# Patient Record
Sex: Female | Born: 1960 | Race: White | Hispanic: No | Smoking: Current every day smoker
Health system: Southern US, Community
[De-identification: ages and names within clinical notes are randomized; demographics above are authoritative.]

## PROBLEM LIST (undated history)

## (undated) DIAGNOSIS — I10 Essential (primary) hypertension: Secondary | ICD-10-CM

## (undated) DIAGNOSIS — M1990 Unspecified osteoarthritis, unspecified site: Secondary | ICD-10-CM

## (undated) DIAGNOSIS — E785 Hyperlipidemia, unspecified: Secondary | ICD-10-CM

## (undated) DIAGNOSIS — F329 Major depressive disorder, single episode, unspecified: Secondary | ICD-10-CM

## (undated) DIAGNOSIS — J449 Chronic obstructive pulmonary disease, unspecified: Secondary | ICD-10-CM

## (undated) DIAGNOSIS — F32A Depression, unspecified: Secondary | ICD-10-CM

## (undated) DIAGNOSIS — E079 Disorder of thyroid, unspecified: Secondary | ICD-10-CM

## (undated) HISTORY — PX: OTHER SURGICAL HISTORY: SHX169

## (undated) HISTORY — DX: Essential (primary) hypertension: I10

## (undated) HISTORY — DX: Depression, unspecified: F32.A

## (undated) HISTORY — DX: Unspecified osteoarthritis, unspecified site: M19.90

## (undated) HISTORY — PX: TUBAL LIGATION: SHX77

## (undated) HISTORY — DX: Hyperlipidemia, unspecified: E78.5

## (undated) HISTORY — DX: Disorder of thyroid, unspecified: E07.9

## (undated) HISTORY — DX: Major depressive disorder, single episode, unspecified: F32.9

---

## 2014-05-18 DIAGNOSIS — I10 Essential (primary) hypertension: Secondary | ICD-10-CM | POA: Insufficient documentation

## 2014-05-18 DIAGNOSIS — E669 Obesity, unspecified: Secondary | ICD-10-CM | POA: Insufficient documentation

## 2014-06-15 DIAGNOSIS — D259 Leiomyoma of uterus, unspecified: Secondary | ICD-10-CM | POA: Insufficient documentation

## 2018-02-24 ENCOUNTER — Encounter: Payer: Self-pay | Admitting: Nurse Practitioner

## 2018-02-24 ENCOUNTER — Other Ambulatory Visit: Payer: Self-pay

## 2018-02-24 ENCOUNTER — Ambulatory Visit: Payer: Medicaid Other | Admitting: Nurse Practitioner

## 2018-02-24 VITALS — BP 105/70 | HR 83 | Temp 98.5°F | Ht 66.5 in | Wt 235.0 lb

## 2018-02-24 DIAGNOSIS — Z6837 Body mass index (BMI) 37.0-37.9, adult: Secondary | ICD-10-CM

## 2018-02-24 DIAGNOSIS — M545 Low back pain, unspecified: Secondary | ICD-10-CM | POA: Insufficient documentation

## 2018-02-24 DIAGNOSIS — F339 Major depressive disorder, recurrent, unspecified: Secondary | ICD-10-CM

## 2018-02-24 DIAGNOSIS — Z23 Encounter for immunization: Secondary | ICD-10-CM | POA: Diagnosis not present

## 2018-02-24 DIAGNOSIS — E039 Hypothyroidism, unspecified: Secondary | ICD-10-CM | POA: Diagnosis not present

## 2018-02-24 DIAGNOSIS — M25552 Pain in left hip: Secondary | ICD-10-CM

## 2018-02-24 DIAGNOSIS — F17210 Nicotine dependence, cigarettes, uncomplicated: Secondary | ICD-10-CM

## 2018-02-24 DIAGNOSIS — G8929 Other chronic pain: Secondary | ICD-10-CM

## 2018-02-24 DIAGNOSIS — I10 Essential (primary) hypertension: Secondary | ICD-10-CM

## 2018-02-24 DIAGNOSIS — E66812 Obesity, class 2: Secondary | ICD-10-CM

## 2018-02-24 NOTE — Assessment & Plan Note (Addendum)
Chronic, ongoing.  Continue current Levothyroxine dose, 200 MCG, with thyroid panel next visit.

## 2018-02-24 NOTE — Assessment & Plan Note (Addendum)
BMI 37.36. Discussed healthy diet and exercise for weight loss.

## 2018-02-24 NOTE — Patient Instructions (Signed)
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
## 2018-02-24 NOTE — Assessment & Plan Note (Addendum)
Chronic, BP below goal on exam today.  Continue current regimen and adjust dose as needed dependent on BP readings.  CBC and CMP next visit.

## 2018-02-24 NOTE — Assessment & Plan Note (Signed)
Chronic, ongoing.  She denies SI/HI.  Reports stable mood on current treatment.  Continue Sertraline 100 MG daily and monitor.

## 2018-02-24 NOTE — Assessment & Plan Note (Addendum)
Chronic with worsening pain.  Encouraged use of Tylenol 650 MG Q6H as needed, heat, Icy/Hot Lidocaine patches.  Consider referral to PT if ongoing pain.

## 2018-02-24 NOTE — Assessment & Plan Note (Addendum)
Continues to smoke daily.  Encouraged cessation and discussed OTC and prescription medications to assist.  At this time she is not interested in cessation.  Spirometry testing next visit.

## 2018-02-24 NOTE — Assessment & Plan Note (Addendum)
Chronic, ongoing.  Recommended use of Tylenol 650 MG every 6 hours as needed and heat + Icy/Hot Lidocaine patches + stretching daily. Consider referral to pain clinic if poor response to OTC methods.

## 2018-03-18 ENCOUNTER — Other Ambulatory Visit: Payer: Self-pay | Admitting: Nurse Practitioner

## 2018-03-18 MED ORDER — LEVOTHYROXINE SODIUM 200 MCG PO TABS: 200.0000 ug | ORAL_TABLET | Freq: Every day | ORAL | 3 refills | Status: DC

## 2018-03-18 MED ORDER — LISINOPRIL-HYDROCHLOROTHIAZIDE 20-25 MG PO TABS: 1.0000 | ORAL_TABLET | Freq: Every day | ORAL | 3 refills | Status: DC

## 2018-03-18 MED ORDER — ATORVASTATIN CALCIUM 10 MG PO TABS: 10.0000 mg | ORAL_TABLET | Freq: Every day | ORAL | 3 refills | Status: DC

## 2018-03-18 MED ORDER — SERTRALINE HCL 100 MG PO TABS: 100.0000 mg | ORAL_TABLET | Freq: Every day | ORAL | 3 refills | Status: DC

## 2018-03-18 NOTE — Telephone Encounter (Signed)
Requested medication (s) are due for refill today: yes  Requested medication (s) are on the active medication list: yes  Last refill:  Last refilled by historical provider  Future visit scheduled: yes  Notes to clinic:  Unable to refill per protocol. Medication last filled by historical provider    Requested Prescriptions  Pending Prescriptions Disp Refills   atorvastatin (LIPITOR) 10 MG tablet      Sig: Take 1 tablet (10 mg total) by mouth daily.     Cardiovascular:  Antilipid - Statins Failed - 03/18/2018 10:39 AM      Failed - Total Cholesterol in normal range and within 360 days    No results found for: CHOL, POCCHOL       Failed - LDL in normal range and within 360 days    No results found for: LDLCALC, LDLC, HIRISKLDL       Failed - HDL in normal range and within 360 days    No results found for: HDL       Failed - Triglycerides in normal range and within 360 days    No results found for: TRIG       Passed - Patient is not pregnant      Passed - Valid encounter within last 12 months    Recent Outpatient Visits          3 weeks ago Left hip pain   [place], Barbaraann Faster, NP      Future Appointments            In 1 week Cannady, Barbaraann Faster, NP [facility]          sertraline (ZOLOFT) 100 MG tablet      Sig: Take by mouth.     Psychiatry:  Antidepressants - SSRI Passed - 03/18/2018 10:39 AM      Passed - Completed PHQ-2 or PHQ-9 in the last 360 days.      Passed - Valid encounter within last 6 months    Recent Outpatient Visits          3 weeks ago Left hip pain   [place], Barbaraann Faster, NP      Future Appointments            In 1 week Cannady, Barbaraann Faster, NP [facility], [facility]          lisinopril-hydrochlorothiazide (PRINZIDE,ZESTORETIC) 20-25 MG tablet      Sig: Take by mouth.     Cardiovascular:  ACEI + Diuretic Combos Failed - 03/18/2018 10:39 AM      Failed - Na in normal  range and within 180 days    No results found for: NA       Failed - K in normal range and within 180 days    No results found for: K, POTASSIUM       Failed - Cr in normal range and within 180 days    No results found for: CREATININE       Failed - Ca in normal range and within 180 days    No results found for: CALCIUM, CORRECTEDCA, CAWHOLEBLD, POCCA       Passed - Patient is not pregnant      Passed - Last BP in normal range    BP Readings from Last 1 Encounters:  02/24/18 105/70         Passed - Valid encounter within last 6 months    Recent Outpatient Visits  3 weeks ago Left hip pain   [place], Barbaraann Faster, NP      Future Appointments            In 1 week Cannady, Barbaraann Faster, NP [facility], [facility]          levothyroxine (SYNTHROID, LEVOTHROID) 200 MCG tablet      Sig: Take by mouth.     Endocrinology:  Hypothyroid Agents Failed - 03/18/2018 10:39 AM      Failed - TSH needs to be rechecked within 3 months after an abnormal result. Refill until TSH is due.      Failed - TSH in normal range and within 360 days    No results found for: TSH       Passed - Valid encounter within last 12 months    Recent Outpatient Visits          3 weeks ago Left hip pain   [place], Barbaraann Faster, NP      Future Appointments            In 1 week Cannady, Barbaraann Faster, NP [facility], [facility]

## 2018-03-18 NOTE — Telephone Encounter (Signed)
Copied from [place] [id number]. Topic: Quick Communication - Rx Refill/Question >> Mar 18, 2018 10:23 AM Selinda Flavin B, NT wrote: Medication: atorvastatin (LIPITOR) 10 MG tablet levothyroxine (SYNTHROID, LEVOTHROID) 200 MCG tablet  lisinopril-hydrochlorothiazide (PRINZIDE,ZESTORETIC) 20-25 MG tablet sertraline (ZOLOFT) 100 MG tablet  Has the patient contacted their pharmacy? Yes.  To call office (Agent: If no, request that the patient contact the pharmacy for the refill.) (Agent: If yes, when and what did the pharmacy advise?)  Preferred Pharmacy (with phone number or street name): [id number] - [place], [address]  Agent: Please be advised that RX refills may take up to 3 business days. We ask that you follow-up with your pharmacy.

## 2018-03-18 NOTE — Telephone Encounter (Signed)
Refill requests approved and reviewed with [facility] records.  Labs at her next visit, physical.

## 2018-03-30 ENCOUNTER — Encounter: Payer: Self-pay | Admitting: Nurse Practitioner

## 2018-03-30 ENCOUNTER — Ambulatory Visit: Payer: Medicaid Other | Admitting: Nurse Practitioner

## 2018-03-30 VITALS — BP 136/83 | HR 76 | Temp 99.1°F | Ht 66.5 in | Wt 236.4 lb

## 2018-03-30 DIAGNOSIS — F17210 Nicotine dependence, cigarettes, uncomplicated: Secondary | ICD-10-CM

## 2018-03-30 DIAGNOSIS — J432 Centrilobular emphysema: Secondary | ICD-10-CM | POA: Insufficient documentation

## 2018-03-30 DIAGNOSIS — Z6837 Body mass index (BMI) 37.0-37.9, adult: Secondary | ICD-10-CM

## 2018-03-30 DIAGNOSIS — N3281 Overactive bladder: Secondary | ICD-10-CM

## 2018-03-30 DIAGNOSIS — E039 Hypothyroidism, unspecified: Secondary | ICD-10-CM | POA: Diagnosis not present

## 2018-03-30 DIAGNOSIS — J410 Simple chronic bronchitis: Secondary | ICD-10-CM

## 2018-03-30 DIAGNOSIS — I10 Essential (primary) hypertension: Secondary | ICD-10-CM

## 2018-03-30 DIAGNOSIS — J449 Chronic obstructive pulmonary disease, unspecified: Secondary | ICD-10-CM | POA: Insufficient documentation

## 2018-03-30 DIAGNOSIS — E782 Mixed hyperlipidemia: Secondary | ICD-10-CM | POA: Diagnosis not present

## 2018-03-30 DIAGNOSIS — Z1211 Encounter for screening for malignant neoplasm of colon: Secondary | ICD-10-CM

## 2018-03-30 DIAGNOSIS — M25552 Pain in left hip: Secondary | ICD-10-CM

## 2018-03-30 DIAGNOSIS — Z1239 Encounter for other screening for malignant neoplasm of breast: Secondary | ICD-10-CM

## 2018-03-30 MED ORDER — MELOXICAM 7.5 MG PO TABS: 7.5000 mg | ORAL_TABLET | ORAL | 0 refills | Status: DC | PRN

## 2018-03-30 MED ORDER — FLUTICASONE-SALMETEROL 250-50 MCG/DOSE IN AEPB: 1.0000 | INHALATION_SPRAY | Freq: Two times a day (BID) | RESPIRATORY_TRACT | 3 refills | Status: DC

## 2018-03-30 MED ORDER — MELOXICAM 7.5 MG PO TABS: 7.5000 mg | ORAL_TABLET | Freq: Every day | ORAL | 0 refills | Status: DC

## 2018-03-30 MED ORDER — OXYBUTYNIN CHLORIDE 5 MG PO TABS: 5.0000 mg | ORAL_TABLET | Freq: Every day | ORAL | 1 refills | Status: DC

## 2018-03-30 NOTE — Assessment & Plan Note (Signed)
Chronic, ongoing. Imaging ordered.  Referral to ortho for further evaluation, as ongoing issue for years.  Script for #30 Meloxicam tablet to take daily as needed only.  Due to dx HTN will avoid chronic use.

## 2018-03-30 NOTE — Assessment & Plan Note (Signed)
Chronic, continues to smoke a pack a day.  Will add Advair as maintenance and provided education on this medication.  Continue Albuterol as needed.  Spirometry next visit.

## 2018-03-30 NOTE — Assessment & Plan Note (Signed)
Chronic, BP 136/83 on exam today.  Encouraged to monitor BP at home.  Continue current medication regimen.  CBC and CMP.
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
## 2018-03-30 NOTE — Assessment & Plan Note (Signed)
I have recommended complete cessation of tobacco use. I have discussed various options available for assistance with tobacco cessation including over the counter methods (Nicotine gum, patch and lozenges). We also discussed prescription options (Chantix, Nicotine Inhaler / Nasal Spray). The patient is not interested in pursuing any prescription tobacco cessation options at this time.  

## 2018-03-30 NOTE — Assessment & Plan Note (Signed)
Continue current medication regimen.  Lipid panel today.

## 2018-03-30 NOTE — Assessment & Plan Note (Signed)
TSH today.  Continue current med regimen and adjust as needed.

## 2018-03-30 NOTE — Assessment & Plan Note (Signed)
Refuses internal exam today.  Ongoing issue x 10 + years.  Will trial Oxybutynin 5 MG daily and reassess at upcoming visit, adjust dose as needed if improvement.  Consider urology or GYN referral if ongoing issues.

## 2018-03-30 NOTE — Patient Instructions (Signed)
COPD and Physical Activity  Chronic obstructive pulmonary disease (COPD) is a long-term (chronic) condition that affects the lungs. COPD is a general term that can be used to describe many different lung problems that cause lung swelling (inflammation) and limit airflow, including chronic bronchitis and emphysema.  The main symptom of COPD is shortness of breath, which makes it harder to do even simple tasks. This can also make it harder to exercise and be active. Talk with your health care provider about treatments to help you breathe better and actions you can take to prevent breathing problems during physical activity.  What are the benefits of exercising with COPD?  Exercising regularly is an important part of a healthy lifestyle. You can still exercise and do physical activities even though you have COPD. Exercise and physical activity improve your shortness of breath by increasing blood flow (circulation). This causes your heart to pump more oxygen through your body. Moderate exercise can improve your:  · Oxygen use.  · Energy level.  · Shortness of breath.  · Strength in your breathing muscles.  · Heart health.  · Sleep.  · Self-esteem and feelings of self-worth.  · Depression, stress, and anxiety levels.  Exercise can benefit everyone with COPD. The severity of your disease may affect how hard you can exercise, especially at first, but everyone can benefit. Talk with your health care provider about how much exercise is safe for you, and which activities and exercises are safe for you.  What actions can I take to prevent breathing problems during physical activity?  · Sign up for a pulmonary rehabilitation program. This type of program may include:  ? Education about lung diseases.  ? Exercise classes that teach you how to exercise and be more active while improving your breathing. This usually involves:  § Exercise using your lower extremities, such as a stationary bicycle.  § About 30 minutes of exercise, 2  to 5 times per week, for 6 to 12 weeks  § Strength training, such as push ups or leg lifts.  ? Nutrition education.  ? Group classes in which you can talk with others who also have COPD and learn ways to manage stress.  · If you use an oxygen tank, you should use it while you exercise. Work with your health care provider to adjust your oxygen for your physical activity. Your resting flow rate is different from your flow rate during physical activity.  · While you are exercising:  ? Take slow breaths.  ? Pace yourself and do not try to go too fast.  ? Purse your lips while breathing out. Pursing your lips is similar to a kissing or whistling position.  ? If doing exercise that uses a quick burst of effort, such as weight lifting:  § Breathe in before starting the exercise.  § Breathe out during the hardest part of the exercise (such as raising the weights).  Where to find support  You can find support for exercising with COPD from:  · Your health care provider.  · A pulmonary rehabilitation program.  · Your local health department or community health programs.  · Support groups, online or in-person. Your health care provider may be able to recommend support groups.  Where to find more information  You can find more information about exercising with COPD from:  · American Lung Association: lung.org.  · COPD Foundation: copdfoundation.org.  Contact a health care provider if:  · Your symptoms get worse.  · You   have chest pain.  · You have nausea.  · You have a fever.  · You have trouble talking or catching your breath.  · You want to start a new exercise program or a new activity.  Summary  · COPD is a general term that can be used to describe many different lung problems that cause lung swelling (inflammation) and limit airflow. This includes chronic bronchitis and emphysema.  · Exercise and physical activity improve your shortness of breath by increasing blood flow (circulation). This causes your heart to provide more  oxygen to your body.  · Contact your health care provider before starting any exercise program or new activity. Ask your health care provider what exercises and activities are safe for you.  This information is not intended to replace advice given to you by your health care provider. Make sure you discuss any questions you have with your health care provider.  Document Released: 04/09/2017 Document Revised: 04/09/2017 Document Reviewed: 04/09/2017  Elsevier Interactive Patient Education © 2019 [employer]

## 2018-03-30 NOTE — Assessment & Plan Note (Signed)
Recommended diet changes and daily exercise.

## 2018-03-31 LAB — COMPREHENSIVE METABOLIC PANEL
ALT: 13 IU/L (ref 0–32)
AST: 15 IU/L (ref 0–40)
Albumin/Globulin Ratio: 1.4 (ref 1.2–2.2)
Albumin: 4.3 g/dL (ref 3.5–5.5)
Alkaline Phosphatase: 96 IU/L (ref 39–117)
BUN/Creatinine Ratio: 18 (ref 9–23)
BUN: 20 mg/dL (ref 6–24)
Bilirubin Total: 0.4 mg/dL (ref 0.0–1.2)
CO2: 23 mmol/L (ref 20–29)
Calcium: 9.1 mg/dL (ref 8.7–10.2)
Chloride: 104 mmol/L (ref 96–106)
Creatinine, Ser: 1.09 mg/dL — ABNORMAL HIGH (ref 0.57–1.00)
GFR calc Af Amer: 66 mL/min/{1.73_m2} (ref >59)
GFR calc non Af Amer: 57 mL/min/{1.73_m2} — ABNORMAL LOW (ref >59)
Globulin, Total: 3.1 g/dL (ref 1.5–4.5)
Glucose: 99 mg/dL (ref 65–99)
Potassium: 4.1 mmol/L (ref 3.5–5.2)
Sodium: 143 mmol/L (ref 134–144)
Total Protein: 7.4 g/dL (ref 6.0–8.5)

## 2018-03-31 LAB — CBC WITH DIFFERENTIAL/PLATELET
Basophils Absolute: 0.1 10*3/uL (ref 0.0–0.2)
Basos: 0 %
EOS (ABSOLUTE): 0.2 10*3/uL (ref 0.0–0.4)
Eos: 2 %
Hematocrit: 35.4 % (ref 34.0–46.6)
Hemoglobin: 11.6 g/dL (ref 11.1–15.9)
Immature Grans (Abs): 0 10*3/uL (ref 0.0–0.1)
Immature Granulocytes: 0 %
Lymphocytes Absolute: 2.4 10*3/uL (ref 0.7–3.1)
Lymphs: 21 %
MCH: 29.3 pg (ref 26.6–33.0)
MCHC: 32.8 g/dL (ref 31.5–35.7)
MCV: 89 fL (ref 79–97)
Monocytes Absolute: 0.6 10*3/uL (ref 0.1–0.9)
Monocytes: 5 %
Neutrophils Absolute: 8.2 10*3/uL — ABNORMAL HIGH (ref 1.4–7.0)
Neutrophils: 72 %
Platelets: 200 10*3/uL (ref 150–450)
RBC: 3.96 x10E6/uL (ref 3.77–5.28)
RDW: 13.8 % (ref 12.3–15.4)
WBC: 11.4 10*3/uL — ABNORMAL HIGH (ref 3.4–10.8)

## 2018-03-31 LAB — LIPID PANEL W/O CHOL/HDL RATIO
Cholesterol, Total: 172 mg/dL (ref 100–199)
HDL: 45 mg/dL (ref >39)
LDL Calculated: 94 mg/dL (ref 0–99)
Triglycerides: 166 mg/dL — ABNORMAL HIGH (ref 0–149)
VLDL Cholesterol Cal: 33 mg/dL (ref 5–40)

## 2018-03-31 LAB — TSH: TSH: 0.195 u[IU]/mL — ABNORMAL LOW (ref 0.450–4.500)

## 2018-04-01 ENCOUNTER — Telehealth: Payer: Self-pay | Admitting: Nurse Practitioner

## 2018-04-01 DIAGNOSIS — D72820 Lymphocytosis (symptomatic): Secondary | ICD-10-CM

## 2018-04-01 DIAGNOSIS — D72828 Other elevated white blood cell count: Secondary | ICD-10-CM

## 2018-04-01 MED ORDER — LEVOTHYROXINE SODIUM 175 MCG PO TABS: 175.0000 ug | ORAL_TABLET | Freq: Every day | ORAL | 5 refills | Status: DC

## 2018-04-01 NOTE — Telephone Encounter (Signed)
Spoke to Katrina Curtis via telephone to review lab results.  Discussed that her thyroid test was on lower side and will send in new prescription to pharmacy to lower Levothyroxine dose to 175 MCG (currently on 200 MCG daily).  Will recheck lab at her next visit in 4 weeks.  Also discussed with her that her WBC and neutrophils were slightly elevated.  She recently reports getting over a "cold" and states symptoms are improving.  Discussed with her if symptoms worsen to schedule appointment at office.  Would like her to come in next week for lab recheck only of CBC.  She agrees with POC and was able to verbalize back.

## 2018-04-09 ENCOUNTER — Telehealth: Payer: Self-pay

## 2018-04-09 ENCOUNTER — Other Ambulatory Visit: Payer: Self-pay

## 2018-04-09 DIAGNOSIS — Z1211 Encounter for screening for malignant neoplasm of colon: Secondary | ICD-10-CM

## 2018-04-09 NOTE — Telephone Encounter (Signed)
Colonoscopy has been scheduled for 04/26/18 with Dr. Bonna Gains at [facility].  Thanks [employer]

## 2018-04-23 ENCOUNTER — Encounter: Payer: Self-pay | Admitting: *Deleted

## 2018-04-26 ENCOUNTER — Ambulatory Visit: Payer: Medicaid Other | Admitting: Anesthesiology

## 2018-04-26 ENCOUNTER — Encounter: Payer: Self-pay | Admitting: Anesthesiology

## 2018-04-26 ENCOUNTER — Ambulatory Visit: Payer: Medicaid Other | Admitting: Nurse Practitioner

## 2018-04-26 ENCOUNTER — Encounter
Admission: RE | Disposition: A | Discharge status: Home / Self Care | Payer: Self-pay | Source: Home / Self Care | Attending: Gastroenterology

## 2018-04-26 ENCOUNTER — Ambulatory Visit
Admission: RE | Admit: 2018-04-26 | Discharge: 2018-04-26 | Disposition: A | Discharge status: Home / Self Care | Payer: Medicaid Other | Attending: Gastroenterology | Admitting: Gastroenterology

## 2018-04-26 DIAGNOSIS — K648 Other hemorrhoids: Secondary | ICD-10-CM | POA: Diagnosis not present

## 2018-04-26 DIAGNOSIS — Z1211 Encounter for screening for malignant neoplasm of colon: Secondary | ICD-10-CM | POA: Diagnosis not present

## 2018-04-26 DIAGNOSIS — M1990 Unspecified osteoarthritis, unspecified site: Secondary | ICD-10-CM | POA: Diagnosis not present

## 2018-04-26 DIAGNOSIS — Z880 Allergy status to penicillin: Secondary | ICD-10-CM | POA: Insufficient documentation

## 2018-04-26 DIAGNOSIS — J449 Chronic obstructive pulmonary disease, unspecified: Secondary | ICD-10-CM | POA: Diagnosis not present

## 2018-04-26 DIAGNOSIS — K644 Residual hemorrhoidal skin tags: Secondary | ICD-10-CM | POA: Diagnosis not present

## 2018-04-26 DIAGNOSIS — Z791 Long term (current) use of non-steroidal anti-inflammatories (NSAID): Secondary | ICD-10-CM | POA: Insufficient documentation

## 2018-04-26 DIAGNOSIS — F329 Major depressive disorder, single episode, unspecified: Secondary | ICD-10-CM | POA: Insufficient documentation

## 2018-04-26 DIAGNOSIS — D125 Benign neoplasm of sigmoid colon: Secondary | ICD-10-CM | POA: Insufficient documentation

## 2018-04-26 DIAGNOSIS — E039 Hypothyroidism, unspecified: Secondary | ICD-10-CM | POA: Insufficient documentation

## 2018-04-26 DIAGNOSIS — I10 Essential (primary) hypertension: Secondary | ICD-10-CM | POA: Diagnosis not present

## 2018-04-26 DIAGNOSIS — Z79890 Hormone replacement therapy (postmenopausal): Secondary | ICD-10-CM | POA: Insufficient documentation

## 2018-04-26 DIAGNOSIS — E785 Hyperlipidemia, unspecified: Secondary | ICD-10-CM | POA: Diagnosis not present

## 2018-04-26 DIAGNOSIS — Z79899 Other long term (current) drug therapy: Secondary | ICD-10-CM | POA: Diagnosis not present

## 2018-04-26 DIAGNOSIS — K635 Polyp of colon: Secondary | ICD-10-CM | POA: Diagnosis not present

## 2018-04-26 DIAGNOSIS — Z7951 Long term (current) use of inhaled steroids: Secondary | ICD-10-CM | POA: Diagnosis not present

## 2018-04-26 DIAGNOSIS — F17210 Nicotine dependence, cigarettes, uncomplicated: Secondary | ICD-10-CM | POA: Insufficient documentation

## 2018-04-26 DIAGNOSIS — D123 Benign neoplasm of transverse colon: Secondary | ICD-10-CM

## 2018-04-26 DIAGNOSIS — D1779 Benign lipomatous neoplasm of other sites: Secondary | ICD-10-CM | POA: Insufficient documentation

## 2018-04-26 DIAGNOSIS — E782 Mixed hyperlipidemia: Secondary | ICD-10-CM | POA: Diagnosis not present

## 2018-04-26 HISTORY — PX: COLONOSCOPY WITH PROPOFOL: SHX5780

## 2018-04-26 SURGERY — COLONOSCOPY WITH PROPOFOL
Anesthesia: General

## 2018-04-26 MED ORDER — PHENYLEPHRINE HCL 10 MG/ML IJ SOLN
INTRAMUSCULAR | Status: DC | PRN
  Administered 2018-04-26 (×2): 100 ug via INTRAVENOUS

## 2018-04-26 MED ORDER — PROPOFOL 500 MG/50ML IV EMUL
INTRAVENOUS | Status: AC
  Filled 2018-04-26: qty 50

## 2018-04-26 MED ORDER — PHENYLEPHRINE HCL 10 MG/ML IJ SOLN
INTRAMUSCULAR | Status: AC
  Filled 2018-04-26: qty 1

## 2018-04-26 MED ORDER — PROPOFOL 10 MG/ML IV BOLUS
INTRAVENOUS | Status: AC
  Filled 2018-04-26: qty 40

## 2018-04-26 MED ORDER — PROPOFOL 500 MG/50ML IV EMUL
INTRAVENOUS | Status: DC | PRN
  Administered 2018-04-26: 130 ug/kg/min via INTRAVENOUS

## 2018-04-26 MED ORDER — LIDOCAINE HCL (PF) 2 % IJ SOLN
INTRAMUSCULAR | Status: AC
  Filled 2018-04-26: qty 10

## 2018-04-26 MED ORDER — LIDOCAINE HCL (CARDIAC) PF 100 MG/5ML IV SOSY
PREFILLED_SYRINGE | INTRAVENOUS | Status: DC | PRN
  Administered 2018-04-26: 50 mg via INTRAVENOUS

## 2018-04-26 MED ORDER — PROPOFOL 10 MG/ML IV BOLUS
INTRAVENOUS | Status: AC
  Filled 2018-04-26: qty 20

## 2018-04-26 MED ORDER — PROPOFOL 10 MG/ML IV BOLUS
INTRAVENOUS | Status: DC | PRN
  Administered 2018-04-26: 80 mg via INTRAVENOUS
  Administered 2018-04-26 (×2): 27 mg via INTRAVENOUS
  Administered 2018-04-26 (×2): 26 mg via INTRAVENOUS

## 2018-04-26 MED ORDER — SODIUM CHLORIDE 0.9 % IV SOLN
INTRAVENOUS | Status: DC
  Administered 2018-04-26: 13:00:00 via INTRAVENOUS
  Administered 2018-04-26: 1000 mL via INTRAVENOUS

## 2018-04-26 NOTE — Anesthesia Post-op Follow-up Note (Signed)
Anesthesia QCDR form completed.        

## 2018-04-26 NOTE — Transfer of Care (Signed)
Immediate Anesthesia Transfer of Care Note  Patient: Katrina Curtis  Procedure(s) Performed: COLONOSCOPY WITH PROPOFOL (N/A )  Patient Location: PACU and Endoscopy Unit  Anesthesia Type:General  Level of Consciousness: drowsy  Airway & Oxygen Therapy: Patient Spontanous Breathing  Post-op Assessment: Report given to RN and Post -op Vital signs reviewed and stable  Post vital signs: Reviewed and stable  Last Vitals:  Vitals Value Taken Time  BP 98/65 04/26/2018  1:53 PM  Temp 36.1 C 04/26/2018  1:53 PM  Pulse 67 04/26/2018  1:53 PM  Resp 23 04/26/2018  1:53 PM  SpO2 94 % 04/26/2018  1:53 PM    Last Pain:  Vitals:   04/26/18 1353  TempSrc: Tympanic  PainSc: Asleep         Complications: No apparent anesthesia complications
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
## 2018-04-26 NOTE — Anesthesia Preprocedure Evaluation (Signed)
Anesthesia Evaluation  Patient identified by MRN, date of birth, ID band Patient awake    Reviewed: Allergy & Precautions, NPO status , Patient's Chart, lab work & pertinent test results, reviewed documented beta blocker date and time   Airway Mallampati: II  TM Distance: >3 FB     Dental  (+) Chipped   Pulmonary COPD, Current Smoker,           Cardiovascular hypertension, Pt. on medications      Neuro/Psych PSYCHIATRIC DISORDERS Depression    GI/Hepatic   Endo/Other  Hypothyroidism   Renal/GU      Musculoskeletal  (+) Arthritis ,   Abdominal   Peds  Hematology   Anesthesia Other Findings   Reproductive/Obstetrics                             Anesthesia Physical Anesthesia Plan  ASA: III  Anesthesia Plan: General   Post-op Pain Management:    Induction: Intravenous  PONV Risk Score and Plan:   Airway Management Planned:   Additional Equipment:   Intra-op Plan:   Post-operative Plan:   Informed Consent: I have reviewed the patients History and Physical, chart, labs and discussed the procedure including the risks, benefits and alternatives for the proposed anesthesia with the patient or authorized representative who has indicated his/her understanding and acceptance.       Plan Discussed with: CRNA  Anesthesia Plan Comments:         Anesthesia Quick Evaluation

## 2018-04-26 NOTE — Op Note (Signed)
[facility] Gastroenterology Patient Name: Katrina Curtis Procedure Date: 04/26/2018 12:47 PM MRN: [id number] Account #: [id number] Date of Birth: 1960-10-21 Admit Type: Outpatient Age: 58 Room: [facility] ENDO ROOM 2 Gender: Female Note Status: Finalized Procedure:            Colonoscopy Indications:          Screening for colorectal malignant neoplasm Providers:            Egbert Seidel B. Bonna Gains MD, MD Referring MD:         Forest Gleason Md, MD (Referring MD) Medicines:            Monitored Anesthesia Care Complications:        No immediate complications. Procedure:            Pre-Anesthesia Assessment:                       - ASA Grade Assessment: II - A patient with mild                        systemic disease.                       - Prior to the procedure, a History and Physical was                        performed, and patient medications, allergies and                        sensitivities were reviewed. The patient's tolerance of                        previous anesthesia was reviewed.                       - The risks and benefits of the procedure and the                        sedation options and risks were discussed with the                        patient. All questions were answered and informed                        consent was obtained.                       - Patient identification and proposed procedure were                        verified prior to the procedure by the physician, the                        nurse, the anesthesiologist, the anesthetist and the                        technician. The procedure was verified in the procedure                        room.  After obtaining informed consent, the colonoscope was                        passed under direct vision. Throughout the procedure,                        the patient's blood pressure, pulse, and oxygen                        saturations were monitored continuously. The                       Colonoscope was introduced through the anus and                        advanced to the the cecum, identified by appendiceal                        orifice and ileocecal valve. The colonoscopy was                        performed with ease. The patient tolerated the                        procedure well. The quality of the bowel preparation                        was good except the ascending colon was fair. Findings:      The perianal and digital rectal examinations were normal.      A 15 mm polyp was found in the transverse colon. The polyp was sessile.       Area was successfully injected with Eleview for lesion assessment, and       this injection appeared to lift the lesion adequately. Polypectomy was       attempted, initially using a hot snare. Polyp resection was incomplete       with this device. This intervention then required a different device and       polypectomy technique. The polyp was removed with a cold snare.       Resection and retrieval were complete. To prevent bleeding after the       polypectomy, three hemostatic clips were successfully placed. There was       no bleeding at the end of the procedure.      Two sessile polyps were found in the sigmoid colon. The polyps were 5 to       6 mm in size. These polyps were removed with a cold snare. Resection and       retrieval were complete.      The exam was otherwise without abnormality.      The rectum, sigmoid colon, descending colon, transverse colon, ascending       colon and cecum appeared normal.      Non-bleeding internal hemorrhoids were found during retroflexion. Impression:           - One 15 mm polyp in the transverse colon, removed with                        a cold snare. Resected and retrieved. Injected. Clips  were placed.                       - Two 5 to 6 mm polyps in the sigmoid colon, removed                        with a cold snare. Resected and retrieved.                        - The examination was otherwise normal.                       - The rectum, sigmoid colon, descending colon,                        transverse colon, ascending colon and cecum are normal.                       - Non-bleeding internal hemorrhoids. Recommendation:       - Discharge patient to home (with escort).                       - Advance diet as tolerated.                       - Continue present medications.                       - Await pathology results.                       - Repeat colonoscopy in 1 year.                       - The findings and recommendations were discussed with                        the patient.                       - The findings and recommendations were discussed with                        the patient's family.                       - Return to primary care physician as previously                        scheduled.                       - High fiber diet. Procedure Code(s):    --- Professional ---                       [id number], Colonoscopy, flexible; with removal of tumor(s),                        polyp(s), or other lesion(s) by snare technique                       45381, Colonoscopy, flexible; with directed submucosal  injection(s), any substance Diagnosis Code(s):    --- Professional ---                       Z12.11, Encounter for screening for malignant neoplasm                        of colon                       D12.3, Benign neoplasm of transverse colon (hepatic                        flexure or splenic flexure)                       D12.5, Benign neoplasm of sigmoid colon                       K64.8, Other hemorrhoids CPT copyright 2018 American Medical Association. All rights reserved. The codes documented in this report are preliminary and upon coder review may  be revised to meet current compliance requirements.  Vonda Antigua, MD Margretta Sidle B. Bonna Gains MD, MD 04/26/2018 1:54:15 PM This report has  been signed electronically. Number of Addenda: 0 Note Initiated On: 04/26/2018 12:47 PM Scope Withdrawal Time: 0 hours 36 minutes 8 seconds  Total Procedure Duration: 0 hours 49 minutes 58 seconds  Estimated Blood Loss: Estimated blood loss: none.      [facility]

## 2018-04-26 NOTE — Anesthesia Postprocedure Evaluation (Signed)
Anesthesia Post Note  Patient: Katrina Curtis  Procedure(s) Performed: COLONOSCOPY WITH PROPOFOL (N/A )  Patient location during evaluation: Endoscopy Anesthesia Type: General Level of consciousness: awake and alert Pain management: pain level controlled Vital Signs Assessment: post-procedure vital signs reviewed and stable Respiratory status: spontaneous breathing, nonlabored ventilation, respiratory function stable and patient connected to nasal cannula oxygen Cardiovascular status: blood pressure returned to baseline and stable Postop Assessment: no apparent nausea or vomiting Anesthetic complications: no     Last Vitals:  Vitals:   04/26/18 1413 04/26/18 1423  BP: 103/84 104/79  Pulse: 65 68  Resp: 16 17  Temp:    SpO2: 96% 95%    Last Pain:  Vitals:   04/26/18 1413  TempSrc:   PainSc: 0-No pain                 Liz Pinho S

## 2018-04-28 ENCOUNTER — Encounter: Payer: Self-pay | Admitting: Gastroenterology

## 2018-04-29 ENCOUNTER — Encounter: Payer: Self-pay | Admitting: Gastroenterology

## 2018-04-29 LAB — SURGICAL PATHOLOGY

## 2018-04-30 ENCOUNTER — Telehealth: Payer: Self-pay | Admitting: *Deleted

## 2018-04-30 ENCOUNTER — Ambulatory Visit: Payer: Medicaid Other | Admitting: Nurse Practitioner

## 2018-04-30 ENCOUNTER — Encounter: Payer: Self-pay | Admitting: Nurse Practitioner

## 2018-04-30 VITALS — BP 103/64 | HR 73 | Temp 98.8°F | Ht 70.0 in | Wt 239.8 lb

## 2018-04-30 DIAGNOSIS — N3281 Overactive bladder: Secondary | ICD-10-CM | POA: Diagnosis not present

## 2018-04-30 DIAGNOSIS — Z6837 Body mass index (BMI) 37.0-37.9, adult: Secondary | ICD-10-CM

## 2018-04-30 DIAGNOSIS — J410 Simple chronic bronchitis: Secondary | ICD-10-CM

## 2018-04-30 DIAGNOSIS — M545 Low back pain, unspecified: Secondary | ICD-10-CM

## 2018-04-30 DIAGNOSIS — I10 Essential (primary) hypertension: Secondary | ICD-10-CM | POA: Diagnosis not present

## 2018-04-30 DIAGNOSIS — F17210 Nicotine dependence, cigarettes, uncomplicated: Secondary | ICD-10-CM

## 2018-04-30 DIAGNOSIS — M25552 Pain in left hip: Secondary | ICD-10-CM

## 2018-04-30 DIAGNOSIS — G8929 Other chronic pain: Secondary | ICD-10-CM

## 2018-04-30 DIAGNOSIS — Z122 Encounter for screening for malignant neoplasm of respiratory organs: Secondary | ICD-10-CM

## 2018-04-30 DIAGNOSIS — Z87891 Personal history of nicotine dependence: Secondary | ICD-10-CM

## 2018-04-30 DIAGNOSIS — E039 Hypothyroidism, unspecified: Secondary | ICD-10-CM | POA: Diagnosis not present

## 2018-04-30 MED ORDER — SERTRALINE HCL 100 MG PO TABS: 200.0000 mg | ORAL_TABLET | Freq: Every day | ORAL | 6 refills | Status: DC

## 2018-04-30 NOTE — Assessment & Plan Note (Signed)
I have recommended complete cessation of tobacco use. I have discussed various options available for assistance with tobacco cessation including over the counter methods (Nicotine gum, patch and lozenges). We also discussed prescription options (Chantix, Nicotine Inhaler / Nasal Spray). The patient is not interested in pursuing any prescription tobacco cessation options at this time.  

## 2018-04-30 NOTE — Assessment & Plan Note (Signed)
Recheck TSH today and adjust medication as needed.

## 2018-04-30 NOTE — Assessment & Plan Note (Signed)
Placed new ortho referral and imaging order.

## 2018-04-30 NOTE — Assessment & Plan Note (Signed)
Symptoms have improved with medication, continue current regimen.

## 2018-04-30 NOTE — Patient Instructions (Signed)
Coping with Quitting Smoking  Quitting smoking is a physical and mental challenge. You will face cravings, withdrawal symptoms, and temptation. Before quitting, work with your health care provider to make a plan that can help you cope. Preparation can help you quit and keep you from giving in. How can I cope with cravings? Cravings usually last for 5-10 minutes. If you get through it, the craving will pass. Consider taking the following actions to help you cope with cravings:  Keep your mouth busy: ? Chew sugar-free gum. ? Suck on hard candies or a straw. ? Brush your teeth.  Keep your hands and body busy: ? Immediately change to a different activity when you feel a craving. ? Squeeze or play with a ball. ? Do an activity or a hobby, like making bead jewelry, practicing needlepoint, or working with wood. ? Mix up your normal routine. ? Take a short exercise break. Go for a quick walk or run up and down stairs. ? Spend time in public places where smoking is not allowed.  Focus on doing something kind or helpful for someone else.  Call a friend or family member to talk during a craving.  Join a support group.  Call a quit line, such as 1-800-QUIT-NOW.  Talk with your health care provider about medicines that might help you cope with cravings and make quitting easier for you. How can I deal with withdrawal symptoms? Your body may experience negative effects as it tries to get used to not having nicotine in the system. These effects are called withdrawal symptoms. They may include:  Feeling hungrier than normal.  Trouble concentrating.  Irritability.  Trouble sleeping.  Feeling depressed.  Restlessness and agitation.  Craving a cigarette. To manage withdrawal symptoms:  Avoid places, people, and activities that trigger your cravings.  Remember why you want to quit.  Get plenty of sleep.  Avoid coffee and other caffeinated drinks. These may worsen some of your symptoms.  How can I handle social situations? Social situations can be difficult when you are quitting smoking, especially in the first few weeks. To manage this, you can:  Avoid parties, bars, and other social situations where people might be smoking.  Avoid alcohol.  Leave right away if you have the urge to smoke.  Explain to your family and friends that you are quitting smoking. Ask for understanding and support.  Plan activities with friends or family where smoking is not an option. What are some ways I can cope with stress? Wanting to smoke may cause stress, and stress can make you want to smoke. Find ways to manage your stress. Relaxation techniques can help. For example:  Breathe slowly and deeply, in through your nose and out through your mouth.  Listen to soothing, relaxing music.  Talk with a family member or friend about your stress.  Light a candle.  Soak in a bath or take a shower.  Think about a peaceful place. What are some ways I can prevent weight gain? Be aware that many people gain weight after they quit smoking. However, not everyone does. To keep from gaining weight, have a plan in place before you quit and stick to the plan after you quit. Your plan should include:  Having healthy snacks. When you have a craving, it may help to: ? Eat plain popcorn, crunchy carrots, celery, or other cut vegetables. ? Chew sugar-free gum.  Changing how you eat: ? Eat small portion sizes at meals. ? Eat 4-6 small meals   throughout the day instead of 1-2 large meals a day. ? Be mindful when you eat. Do not watch television or do other things that might distract you as you eat.  Exercising regularly: ? Make time to exercise each day. If you do not have time for a long workout, do short bouts of exercise for 5-10 minutes several times a day. ? Do some form of strengthening exercise, like weight lifting, and some form of aerobic exercise, like running or swimming.  Drinking plenty of  water or other low-calorie or no-calorie drinks. Drink 6-8 glasses of water daily, or as much as instructed by your health care provider. Summary  Quitting smoking is a physical and mental challenge. You will face cravings, withdrawal symptoms, and temptation to smoke again. Preparation can help you as you go through these challenges.  You can cope with cravings by keeping your mouth busy (such as by chewing gum), keeping your body and hands busy, and making calls to family, friends, or a helpline for people who want to quit smoking.  You can cope with withdrawal symptoms by avoiding places where people smoke, avoiding drinks with caffeine, and getting plenty of rest.  Ask your health care provider about the different ways to prevent weight gain, avoid stress, and handle social situations. This information is not intended to replace advice given to you by your health care provider. Make sure you discuss any questions you have with your health care provider. Document Released: 03/14/2016 Document Revised: 03/14/2016 Document Reviewed: 03/14/2016 Elsevier Interactive Patient Education  2019 [employer]  

## 2018-04-30 NOTE — Assessment & Plan Note (Signed)
Chronic, ongoing.  FVC 73% today.  Continue current inhaler regimen and add on LAMA if progression or frequent exacerbation.  Doing well with Advair, which was added on at last visit.  Continue to encourage smoking cessation.
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
## 2018-04-30 NOTE — Telephone Encounter (Signed)
Received referral for initial lung cancer screening scan. Contacted patient and obtained smoking history,(current, 41 pack year) as well as answering questions related to screening process. Patient denies signs of lung cancer such as weight loss or hemoptysis. Patient denies comorbidity that would prevent curative treatment if lung cancer were found. Patient is scheduled for shared decision making visit and CT scan on 05/11/18 at 130pm.

## 2018-04-30 NOTE — Assessment & Plan Note (Signed)
Recommend focus on healthy diet and regular exercise (30 minutes 5 days a week).

## 2018-04-30 NOTE — Assessment & Plan Note (Signed)
Chronic, ongoing.  Continue current medication regimen.  BP below goal today.

## 2018-05-01 LAB — THYROID PANEL WITH TSH
Free Thyroxine Index: 1.8 (ref 1.2–4.9)
T3 Uptake Ratio: 28 % (ref 24–39)
T4, Total: 6.5 ug/dL (ref 4.5–12.0)
TSH: 0.559 u[IU]/mL (ref 0.450–4.500)

## 2018-05-11 ENCOUNTER — Ambulatory Visit
Admission: RE | Admit: 2018-05-11 | Discharge: 2018-05-11 | Disposition: A | Discharge status: Home / Self Care | Payer: Medicaid Other | Source: Ambulatory Visit | Attending: Oncology | Admitting: Oncology

## 2018-05-11 ENCOUNTER — Inpatient Hospital Stay: Payer: Medicaid Other | Attending: Oncology | Admitting: Nurse Practitioner

## 2018-05-11 ENCOUNTER — Encounter: Payer: Self-pay | Admitting: Nurse Practitioner

## 2018-05-11 DIAGNOSIS — Z87891 Personal history of nicotine dependence: Secondary | ICD-10-CM | POA: Diagnosis not present

## 2018-05-11 DIAGNOSIS — Z122 Encounter for screening for malignant neoplasm of respiratory organs: Secondary | ICD-10-CM | POA: Diagnosis not present

## 2018-05-11 NOTE — Progress Notes (Signed)
In accordance with CMS guidelines, patient has met eligibility criteria including age, absence of signs or symptoms of lung cancer.  Social History   Tobacco Use  . Smoking status: Current Every Day Smoker    Packs/day: 1.00    Years: 41.00    Pack years: 41.00  . Smokeless tobacco: Never Used  Substance Use Topics  . Alcohol use: Yes    Alcohol/week: 3.0 standard drinks    Types: 3 Glasses of wine per week  . Drug use: Not Currently      A shared decision-making session was conducted prior to the performance of CT scan. This includes one or more decision aids, includes benefits and harms of screening, follow-up diagnostic testing, over-diagnosis, false positive rate, and total radiation exposure.   Counseling on the importance of adherence to annual lung cancer LDCT screening, impact of co-morbidities, and ability or willingness to undergo diagnosis and treatment is imperative for compliance of the program.   Counseling on the importance of continued smoking cessation for former smokers; the importance of smoking cessation for current smokers, and information about tobacco cessation interventions have been given to patient including [place] and 1800 quit [place] programs.   Written order for lung cancer screening with LDCT has been given to the patient and any and all questions have been answered to the best of my abilities.    Yearly follow up will be coordinated by Burgess Estelle, Thoracic Navigator.  Beckey Rutter, DNP, AGNP-C [facility] [phone] (work cell) [phone] (office) 05/11/18 2:57 PM

## 2018-05-12 ENCOUNTER — Encounter: Payer: Self-pay | Admitting: Nurse Practitioner

## 2018-05-12 ENCOUNTER — Encounter: Payer: Self-pay | Admitting: *Deleted

## 2018-05-12 DIAGNOSIS — I2510 Atherosclerotic heart disease of native coronary artery without angina pectoris: Secondary | ICD-10-CM | POA: Insufficient documentation

## 2018-05-13 DIAGNOSIS — S32050A Wedge compression fracture of fifth lumbar vertebra, initial encounter for closed fracture: Secondary | ICD-10-CM | POA: Diagnosis not present

## 2018-05-13 DIAGNOSIS — M545 Low back pain: Secondary | ICD-10-CM | POA: Diagnosis not present

## 2018-05-13 DIAGNOSIS — M5442 Lumbago with sciatica, left side: Secondary | ICD-10-CM | POA: Diagnosis not present

## 2018-05-13 DIAGNOSIS — M5136 Other intervertebral disc degeneration, lumbar region: Secondary | ICD-10-CM | POA: Diagnosis not present

## 2018-05-13 DIAGNOSIS — M4726 Other spondylosis with radiculopathy, lumbar region: Secondary | ICD-10-CM | POA: Diagnosis not present

## 2018-05-17 ENCOUNTER — Encounter: Payer: Self-pay | Admitting: *Deleted

## 2018-05-17 ENCOUNTER — Other Ambulatory Visit: Payer: Self-pay | Admitting: Sports Medicine

## 2018-05-17 DIAGNOSIS — M4310 Spondylolisthesis, site unspecified: Secondary | ICD-10-CM

## 2018-05-17 DIAGNOSIS — G8929 Other chronic pain: Secondary | ICD-10-CM

## 2018-05-17 DIAGNOSIS — M4726 Other spondylosis with radiculopathy, lumbar region: Secondary | ICD-10-CM

## 2018-05-17 DIAGNOSIS — M5442 Lumbago with sciatica, left side: Principal | ICD-10-CM

## 2018-05-17 DIAGNOSIS — M5416 Radiculopathy, lumbar region: Secondary | ICD-10-CM

## 2018-05-17 DIAGNOSIS — S32050A Wedge compression fracture of fifth lumbar vertebra, initial encounter for closed fracture: Secondary | ICD-10-CM

## 2018-05-17 DIAGNOSIS — M5136 Other intervertebral disc degeneration, lumbar region: Secondary | ICD-10-CM

## 2018-05-17 DIAGNOSIS — M51369 Other intervertebral disc degeneration, lumbar region without mention of lumbar back pain or lower extremity pain: Secondary | ICD-10-CM

## 2018-05-26 ENCOUNTER — Ambulatory Visit
Admission: RE | Admit: 2018-05-26 | Discharge: 2018-05-26 | Disposition: A | Discharge status: Home / Self Care | Payer: Medicaid Other | Source: Ambulatory Visit | Attending: Sports Medicine | Admitting: Sports Medicine

## 2018-05-26 DIAGNOSIS — S32050A Wedge compression fracture of fifth lumbar vertebra, initial encounter for closed fracture: Secondary | ICD-10-CM | POA: Diagnosis not present

## 2018-05-26 DIAGNOSIS — G8929 Other chronic pain: Secondary | ICD-10-CM | POA: Diagnosis not present

## 2018-05-26 DIAGNOSIS — M5442 Lumbago with sciatica, left side: Secondary | ICD-10-CM | POA: Diagnosis not present

## 2018-05-26 DIAGNOSIS — M4726 Other spondylosis with radiculopathy, lumbar region: Secondary | ICD-10-CM

## 2018-05-26 DIAGNOSIS — M51369 Other intervertebral disc degeneration, lumbar region without mention of lumbar back pain or lower extremity pain: Secondary | ICD-10-CM

## 2018-05-26 DIAGNOSIS — M4310 Spondylolisthesis, site unspecified: Secondary | ICD-10-CM | POA: Diagnosis not present

## 2018-05-26 DIAGNOSIS — M5136 Other intervertebral disc degeneration, lumbar region: Secondary | ICD-10-CM | POA: Insufficient documentation

## 2018-05-26 DIAGNOSIS — M545 Low back pain: Secondary | ICD-10-CM | POA: Diagnosis not present

## 2018-05-26 DIAGNOSIS — M5416 Radiculopathy, lumbar region: Secondary | ICD-10-CM | POA: Diagnosis not present

## 2018-05-28 ENCOUNTER — Other Ambulatory Visit: Payer: Self-pay | Admitting: Nurse Practitioner

## 2018-05-28 NOTE — Telephone Encounter (Signed)
Requested medication (s) are due for refill today:  yes  Requested medication (s) are on the active medication list:  yes  Future visit scheduled:  yes  Last Refill: 03/30/18; #30; no refills     Requested Prescriptions  Pending Prescriptions Disp Refills   meloxicam (MOBIC) 7.5 MG tablet [Pharmacy Med Name: MELOXICAM 7.5 MG TABLET] 30 tablet 0    Sig: TAKE 1 TABLET (7.5 MG TOTAL) BY MOUTH AS NEEDED FOR PAIN (ONCE A DAY AS NEEDED ONLY).     Analgesics:  COX2 Inhibitors Failed - 05/28/2018 11:44 AM      Failed - Cr in normal range and within 360 days    Creatinine, Ser  Date Value Ref Range Status  03/30/2018 1.09 (H) 0.57 - 1.00 mg/dL Final         Passed - HGB in normal range and within 360 days    Hemoglobin  Date Value Ref Range Status  03/30/2018 11.6 11.1 - 15.9 g/dL Final         Passed - Patient is not pregnant      Passed - Valid encounter within last 12 months    Recent Outpatient Visits          4 weeks ago Simple chronic bronchitis ([place])   [place], [place] T, NP   1 month ago HTN (hypertension), benign   [place], [place] T, NP   3 months ago Left hip pain   [place], Barbaraann Faster, NP      Future Appointments            In 2 months Cannady, Barbaraann Faster, NP [facility]         Signed Prescriptions Disp Refills   oxybutynin (DITROPAN) 5 MG tablet 90 tablet 0    Sig: TAKE 1 TABLET BY [place]     Urology:  Bladder Agents Passed - 05/28/2018 11:44 AM      Passed - Valid encounter within last 12 months    Recent Outpatient Visits          4 weeks ago Simple chronic bronchitis ([place])   [place], [place] T, NP   1 month ago HTN (hypertension), benign   [place], [place] T, NP   3 months ago Left hip pain   [place], Barbaraann Faster, NP      Future Appointments            In 2 months Cannady, Barbaraann Faster, NP [facility]

## 2018-06-07 DIAGNOSIS — M4726 Other spondylosis with radiculopathy, lumbar region: Secondary | ICD-10-CM | POA: Diagnosis not present

## 2018-06-07 DIAGNOSIS — M5416 Radiculopathy, lumbar region: Secondary | ICD-10-CM | POA: Diagnosis not present

## 2018-06-07 DIAGNOSIS — M5136 Other intervertebral disc degeneration, lumbar region: Secondary | ICD-10-CM | POA: Diagnosis not present

## 2018-06-26 ENCOUNTER — Other Ambulatory Visit: Payer: Self-pay | Admitting: Nurse Practitioner

## 2018-06-27 ENCOUNTER — Other Ambulatory Visit: Payer: Self-pay | Admitting: Nurse Practitioner

## 2018-06-28 ENCOUNTER — Other Ambulatory Visit: Payer: Self-pay | Admitting: Nurse Practitioner

## 2018-06-28 ENCOUNTER — Telehealth: Payer: Self-pay | Admitting: Nurse Practitioner

## 2018-06-28 MED ORDER — SERTRALINE HCL 100 MG PO TABS: 200.0000 mg | ORAL_TABLET | Freq: Every day | ORAL | 3 refills | Status: DC

## 2018-06-28 MED ORDER — MELOXICAM 7.5 MG PO TABS: 7.5000 mg | ORAL_TABLET | ORAL | 0 refills | Status: DC | PRN

## 2018-06-28 NOTE — Progress Notes (Signed)
Sertraline refill sent for three month supply.

## 2018-06-28 NOTE — Telephone Encounter (Signed)
Called pt no answer.LMOM of details below.

## 2018-06-28 NOTE — Telephone Encounter (Signed)
Copied from [place] [id number]. Topic: Quick Communication - See Telephone Encounter >> Jun 28, 2018 12:59 PM Blase Mess A wrote: CRM for notification. See Telephone encounter for: 06/28/18.  Patient is calling regarding her sertraline (ZOLOFT) 100 MG tablet [[id number]] She is supposed to take 2 pills a day. She is needing 60 pills. The script is only for 30 pills. Please advise.  CB- [phone]

## 2018-06-28 NOTE — Telephone Encounter (Signed)
Sent in a three month supply and updated script.

## 2018-07-29 ENCOUNTER — Ambulatory Visit (INDEPENDENT_AMBULATORY_CARE_PROVIDER_SITE_OTHER): Payer: Medicaid Other | Admitting: Family Medicine

## 2018-07-29 ENCOUNTER — Ambulatory Visit: Payer: Medicaid Other | Admitting: Nurse Practitioner

## 2018-07-29 ENCOUNTER — Other Ambulatory Visit: Payer: Self-pay | Admitting: Nurse Practitioner

## 2018-07-29 ENCOUNTER — Encounter: Payer: Self-pay | Admitting: Family Medicine

## 2018-07-29 ENCOUNTER — Other Ambulatory Visit: Payer: Self-pay

## 2018-07-29 VITALS — Temp 97.6°F | Ht 70.0 in | Wt 236.0 lb

## 2018-07-29 DIAGNOSIS — J410 Simple chronic bronchitis: Secondary | ICD-10-CM | POA: Diagnosis not present

## 2018-07-29 DIAGNOSIS — M5416 Radiculopathy, lumbar region: Secondary | ICD-10-CM

## 2018-07-29 MED ORDER — BUDESONIDE-FORMOTEROL FUMARATE 160-4.5 MCG/ACT IN AERO: 2.0000 | INHALATION_SPRAY | Freq: Two times a day (BID) | RESPIRATORY_TRACT | 3 refills | Status: DC

## 2018-07-29 NOTE — Progress Notes (Signed)
Temp 97.6 F (36.4 C) (Tympanic)   Ht 5\' 10"  (1.778 m)   Wt 236 lb (107 kg)   BMI 33.86 kg/m    Subjective:    Patient ID: Katrina Curtis, female    DOB: September 16, 1960, 58 y.o.   MRN: [id number]  HPI: Katrina Curtis is a 58 y.o. female  Chief Complaint  Patient presents with  . COPD    2m f/u  . Back Pain    . This visit was completed via telephone due to the restrictions of the COVID-19 pandemic. All issues as above were discussed and addressed but no physical exam was performed. If it was felt that the patient should be evaluated in the office, they were directed there. The patient verbally consented to this visit. Patient was unable to complete an audio/visual visit due to Technical difficulties,Lack of internet. Due to the catastrophic nature of the COVID-19 pandemic, this visit was done through audio contact only. . Location of the patient: home . Location of the provider: work . Those involved with this call:  . Provider: Merrie Roof, PA-C . CMA: Lesle Chris, Roy . Front Desk/Registration: Jill Side  . Time spent on call: 15 minutes on the phone discussing health concerns. 5 minutes total spent in review of patient's record and preparation of their chart. I verified patient identity using two factors (patient name and date of birth). Patient consents verbally to being seen via telemedicine visit today.   Here today for COPD f/u after addition of advair for maintenance by PCP several months ago. Notes some improvement of sxs but is having a difficult time with the discus device and prefers the albuterol inhaler type better. Also not using daily, only using as needed. Still having days with chest tightness and mild DOE but not often and no major flares.   Also notes she has run out of her gabapentin given by [facility] orthopedics for her radicular back pain. Has not yet reached out to request refills.   Relevant past medical, surgical, family and social history  reviewed and updated as indicated. Interim medical history since our last visit reviewed. Allergies and medications reviewed and updated.  Review of Systems  Per HPI unless specifically indicated above     Objective:    Temp 97.6 F (36.4 C) (Tympanic)   Ht 5\' 10"  (1.778 m)   Wt 236 lb (107 kg)   BMI 33.86 kg/m   Wt Readings from Last 3 Encounters:  07/29/18 236 lb (107 kg)  05/11/18 234 lb (106.1 kg)  04/30/18 239 lb 12.8 oz (108.8 kg)    Physical Exam  Unable to perform PE today as she did not have video technology available for today's visit.   Results for orders placed or performed in visit on 04/30/18  Thyroid Panel With TSH  Result Value Ref Range   TSH 0.559 0.450 - 4.500 uIU/mL   T4, Total 6.5 4.5 - 12.0 ug/dL   T3 Uptake Ratio 28 24 - 39 %   Free Thyroxine Index 1.8 1.2 - 4.9      Assessment & Plan:   Problem List Items Addressed This Visit      Respiratory   COPD (chronic obstructive pulmonary disease) ([place]) - Primary    Will switch to symbicort as pt prefers that type of device. Discussed importance of daily use as prescribed to avoid occurrence of any sxs rather than prn use. Consider switching to anticholinergic inhaler if still having any breakthrough sxs  Relevant Medications   budesonide-formoterol (SYMBICORT) 160-4.5 MCG/ACT inhaler    Other Visit Diagnoses    Lumbar radiculitis       Information given to pt for [facility] to call and request refill of gabapentin. Pt to let us know if she needs any further coordination on the matter       Follow up plan: Return in about 3 months (around 10/28/2018) for 6 month f/u.

## 2018-07-29 NOTE — Telephone Encounter (Signed)
Requested Prescriptions  Pending Prescriptions Disp Refills  . meloxicam (MOBIC) 7.5 MG tablet [Pharmacy Med Name: MELOXICAM 7.5 MG TABLET] 30 tablet 0    Sig: TAKE 1 TABLET (7.5 MG TOTAL) BY MOUTH AS NEEDED FOR PAIN (ONCE A DAY AS NEEDED ONLY).     Analgesics:  COX2 Inhibitors Failed - 07/29/2018  1:19 PM      Failed - Cr in normal range and within 360 days    Creatinine, Ser  Date Value Ref Range Status  03/30/2018 1.09 (H) 0.57 - 1.00 mg/dL Final         Passed - HGB in normal range and within 360 days    Hemoglobin  Date Value Ref Range Status  03/30/2018 11.6 11.1 - 15.9 g/dL Final         Passed - Patient is not pregnant      Passed - Valid encounter within last 12 months    Recent Outpatient Visits          Today Simple chronic bronchitis [facility])   [facility], [place], [place]   3 months ago Simple chronic bronchitis ([place])   [place], [place] T, NP   4 months ago HTN (hypertension), benign   [place], [place] T, NP   5 months ago Left hip pain   [place], Barbaraann Faster, NP

## 2018-07-29 NOTE — Assessment & Plan Note (Signed)
Will switch to symbicort as pt prefers that type of device. Discussed importance of daily use as prescribed to avoid occurrence of any sxs rather than prn use. Consider switching to anticholinergic inhaler if still having any breakthrough sxs

## 2018-08-16 DIAGNOSIS — M47816 Spondylosis without myelopathy or radiculopathy, lumbar region: Secondary | ICD-10-CM | POA: Diagnosis not present

## 2018-08-24 ENCOUNTER — Other Ambulatory Visit: Payer: Self-pay | Admitting: Nurse Practitioner

## 2018-09-02 ENCOUNTER — Other Ambulatory Visit: Payer: Self-pay | Admitting: Nurse Practitioner

## 2018-09-02 NOTE — Telephone Encounter (Signed)
Requested medication (s) are due for refill today: yes  Requested medication (s) are on the active medication list: yes  Last refill:  07/29/18  Future visit scheduled: yes  Notes to clinic:  Cr high    Requested Prescriptions  Pending Prescriptions Disp Refills   meloxicam (MOBIC) 7.5 MG tablet [Pharmacy Med Name: MELOXICAM 7.5 MG TABLET] 30 tablet 0    Sig: TAKE 1 TABLET (7.5 MG TOTAL) BY MOUTH ONCE A DAY AS NEEDED ONLY     Analgesics:  COX2 Inhibitors Failed - 09/02/2018 12:14 PM      Failed - Cr in normal range and within 360 days    Creatinine, Ser  Date Value Ref Range Status  03/30/2018 1.09 (H) 0.57 - 1.00 mg/dL Final         Passed - HGB in normal range and within 360 days    Hemoglobin  Date Value Ref Range Status  03/30/2018 11.6 11.1 - 15.9 g/dL Final         Passed - Patient is not pregnant      Passed - Valid encounter within last 12 months    Recent Outpatient Visits          1 month ago Simple chronic bronchitis ([place])   [place], [place], PA-C   4 months ago Simple chronic bronchitis ([place])   [place], [place] T, NP   5 months ago HTN (hypertension), benign   [place], [place] T, NP   6 months ago Left hip pain   [place], Barbaraann Faster, NP      Future Appointments            In 2 months Cannady, Barbaraann Faster, NP [facility]

## 2018-09-27 ENCOUNTER — Ambulatory Visit (INDEPENDENT_AMBULATORY_CARE_PROVIDER_SITE_OTHER): Payer: Medicaid Other | Admitting: Family Medicine

## 2018-09-27 ENCOUNTER — Ambulatory Visit: Payer: Self-pay

## 2018-09-27 ENCOUNTER — Other Ambulatory Visit: Payer: Self-pay

## 2018-09-27 ENCOUNTER — Encounter: Payer: Self-pay | Admitting: Family Medicine

## 2018-09-27 ENCOUNTER — Telehealth: Payer: Self-pay | Admitting: *Deleted

## 2018-09-27 DIAGNOSIS — Z20822 Contact with and (suspected) exposure to covid-19: Secondary | ICD-10-CM

## 2018-09-27 DIAGNOSIS — Z20828 Contact with and (suspected) exposure to other viral communicable diseases: Secondary | ICD-10-CM | POA: Diagnosis not present

## 2018-09-27 NOTE — Telephone Encounter (Signed)
Patient scheduled today @ 3p @ [facility]. Instructions given and order placed.

## 2018-09-27 NOTE — Telephone Encounter (Signed)
Pt seen via virtual visit this morning

## 2018-09-27 NOTE — Telephone Encounter (Signed)
Patient called and says her roommate found out today that she is positive for covid and she is wanted to be tested. She says they share the same space in the house except both are in their own rooms and they have their own bathrooms. She says he roommate has been on quarantine since last Friday when she was tested. The patient says her symptoms are diarrhea, cough, body aches, which she's had for about a week. She denies SOB, no fever. I called the office and spoke to [place], [facility] who asked to speak to the patient, the call was connected successfully.  Answer Assessment - Initial Assessment Questions 1. CLOSE CONTACT: "Who is the person with the confirmed or suspected COVID-19 infection that you were exposed to?"     Roommate tested positive 2. PLACE of CONTACT: "Where were you when you were exposed to COVID-19?" (e.g., home, school, medical waiting room; which city?)     [place], [place] 3. TYPE of CONTACT: "How much contact was there?" (e.g., sitting next to, live in same house, work in same office, same building)     Live in the same house 4. DURATION of CONTACT: "How long were you in contact with the COVID-19 patient?" (e.g., a few seconds, passed by person, a few minutes, live with the patient)     Live with the patient 5. DATE of CONTACT: "When did you have contact with a COVID-19 patient?" (e.g., how many days ago)     Tested on Friday and found out positive this morning 6. TRAVEL: "Have you traveled out of the country recently?" If so, "When and where?"     * Also ask about out-of-state travel, since the CDC has identified some high-risk cities for community spread in the [place].     * Note: Travel becomes less relevant if there is widespread community transmission where the patient lives.     No 7. COMMUNITY SPREAD: "Are there lots of cases of COVID-19 (community spread) where you live?" (See public health department website, if unsure)       No 8. SYMPTOMS: "Do you have any symptoms?" (e.g.,  fever, cough, breathing difficulty)     Diarrhea, dry cough probably a week for both symptoms, body aches 9. PREGNANCY OR POSTPARTUM: "Is there any chance you are pregnant?" "When was your last menstrual period?" "Did you deliver in the last 2 weeks?"     No 10. HIGH RISK: "Do you have any heart or lung problems? Do you have a weak immune system?" (e.g., CHF, COPD, asthma, HIV positive, chemotherapy, renal failure, diabetes mellitus, sickle cell anemia)     COPD  Protocols used: CORONAVIRUS (COVID-19) EXPOSURE-A-AH

## 2018-09-27 NOTE — Progress Notes (Signed)
   There were no vitals taken for this visit.   Subjective:    Patient ID: Katrina Curtis, female    DOB: 12/21/1960, 58 y.o.   MRN: [id number]  HPI: Katrina Curtis is a 58 y.o. female  Chief Complaint  Patient presents with  . Other    pt states her roommate tested positive for COVID this morning, states she was tested on Friday. States she only symptom she has is diarrhea    . This visit was completed via telephone due to the restrictions of the COVID-19 pandemic. All issues as above were discussed and addressed. Physical exam was done as above through visual confirmation on telephone. If it was felt that the patient should be evaluated in the office, they were directed there. The patient verbally consented to this visit. . Location of the patient: home . Location of the provider: home . Those involved with this call:  . Provider: Merrie Roof, PA-C . CMA: Merilyn Baba, [place] . Front Desk/Registration: Jill Side  . Time spent on call: 15 minutes on the phone discussing health concerns. 5 minutes total spent in review of patient's record and preparation of their chart. I verified patient identity using two factors (patient name and date of birth). Patient consents verbally to being seen via telemedicine visit today.   Here today concerned about COVID 19. Has had some diarrhea for about a week now. Also having dry mouth. Roommate tested positive for COVID 19 Friday. Denies fevers, chills, CP, SOB, cough, congestion, recent travel. Has not been taking anything for sxs.   Relevant past medical, surgical, family and social history reviewed and updated as indicated. Interim medical history since our last visit reviewed. Allergies and medications reviewed and updated.  Review of Systems  Per HPI unless specifically indicated above     Objective:    There were no vitals taken for this visit.  Wt Readings from Last 3 Encounters:  07/29/18 236 lb (107 kg)  05/11/18 234 lb  (106.1 kg)  04/30/18 239 lb 12.8 oz (108.8 kg)    Physical Exam  Unable to perform PE as patient did not have access to video technology for today's visit  Results for orders placed or performed in visit on 04/30/18  Thyroid Panel With TSH  Result Value Ref Range   TSH 0.559 0.450 - 4.500 uIU/mL   T4, Total 6.5 4.5 - 12.0 ug/dL   T3 Uptake Ratio 28 24 - 39 %   Free Thyroxine Index 1.8 1.2 - 4.9      Assessment & Plan:   Problem List Items Addressed This Visit    None    Visit Diagnoses    Exposure to Covid-19 Virus    -  Primary   Will refer for testing, instructed patient to self quarantine until she gets results and is feeling better. Supportive care and return precautions reviewed       Follow up plan: Return if symptoms worsen or fail to improve.

## 2018-09-27 NOTE — Telephone Encounter (Signed)
-----   Message from [place], [place] sent at 09/27/2018 12:27 PM EDT ----- Regarding: COVID-19 test Patient with almost a week of diarrhea and dry mouth, roommate just tested positive a few days ago for COVID 19 and they have had close exposure. Medicaid

## 2018-09-28 ENCOUNTER — Other Ambulatory Visit: Payer: Self-pay | Admitting: Nurse Practitioner

## 2018-09-28 NOTE — Telephone Encounter (Signed)
Please advise if Jolene wants to continue

## 2018-09-30 LAB — NOVEL CORONAVIRUS, NAA: SARS-CoV-2, NAA: NOT DETECTED

## 2018-10-26 ENCOUNTER — Other Ambulatory Visit: Payer: Self-pay | Admitting: Nurse Practitioner

## 2018-11-01 ENCOUNTER — Ambulatory Visit (INDEPENDENT_AMBULATORY_CARE_PROVIDER_SITE_OTHER): Payer: Medicaid Other | Admitting: Nurse Practitioner

## 2018-11-01 ENCOUNTER — Other Ambulatory Visit: Payer: Self-pay

## 2018-11-01 ENCOUNTER — Encounter: Payer: Self-pay | Admitting: Nurse Practitioner

## 2018-11-01 VITALS — BP 97/64 | HR 76 | Temp 98.7°F

## 2018-11-01 DIAGNOSIS — E782 Mixed hyperlipidemia: Secondary | ICD-10-CM | POA: Diagnosis not present

## 2018-11-01 DIAGNOSIS — E039 Hypothyroidism, unspecified: Secondary | ICD-10-CM

## 2018-11-01 DIAGNOSIS — F17210 Nicotine dependence, cigarettes, uncomplicated: Secondary | ICD-10-CM

## 2018-11-01 DIAGNOSIS — G8929 Other chronic pain: Secondary | ICD-10-CM | POA: Diagnosis not present

## 2018-11-01 DIAGNOSIS — Z0100 Encounter for examination of eyes and vision without abnormal findings: Secondary | ICD-10-CM

## 2018-11-01 DIAGNOSIS — I2510 Atherosclerotic heart disease of native coronary artery without angina pectoris: Secondary | ICD-10-CM

## 2018-11-01 DIAGNOSIS — I10 Essential (primary) hypertension: Secondary | ICD-10-CM

## 2018-11-01 DIAGNOSIS — M545 Low back pain: Secondary | ICD-10-CM | POA: Diagnosis not present

## 2018-11-01 DIAGNOSIS — F339 Major depressive disorder, recurrent, unspecified: Secondary | ICD-10-CM | POA: Diagnosis not present

## 2018-11-01 MED ORDER — LISINOPRIL 20 MG PO TABS: 20.0000 mg | ORAL_TABLET | Freq: Every day | ORAL | 3 refills | Status: DC

## 2018-11-01 MED ORDER — GABAPENTIN 300 MG PO CAPS: 300.0000 mg | ORAL_CAPSULE | Freq: Every day | ORAL | 3 refills | Status: DC

## 2018-11-01 NOTE — Patient Instructions (Signed)

## 2018-11-01 NOTE — Assessment & Plan Note (Signed)
Chronic, stable with tight control.  Will discontinue HCTZ, patient with no edema and chronic pain issues, will continue Lisinopril only at 20 MG daily.  Script sent.  Labs today.  Return in 4 weeks to for follow-up med change.

## 2018-11-01 NOTE — Assessment & Plan Note (Signed)
Recommend continuing Atorvastatin and taking a low dose, ASA chewable 81 MG daily. 

## 2018-11-01 NOTE — Assessment & Plan Note (Addendum)
Chronic, ongoing.  Continue current medication regimen and adjust as needed.  Lipid panel and CMP today.    

## 2018-11-01 NOTE — Assessment & Plan Note (Signed)
Chronic, ongoing.  Continue current medication regimen and adjust as needed.  Thyroid labs today. 

## 2018-11-01 NOTE — Assessment & Plan Note (Signed)
I have recommended complete cessation of tobacco use. I have discussed various options available for assistance with tobacco cessation including over the counter methods (Nicotine gum, patch and lozenges). We also discussed prescription options (Chantix, Nicotine Inhaler / Nasal Spray). The patient is not interested in pursuing any prescription tobacco cessation options at this time.  

## 2018-11-01 NOTE — Assessment & Plan Note (Signed)
Chronic, ongoing.  Will restart Gabapentin, script sent.  Patient followed by ortho, Dr. Candelaria Stagers, and was doing PT prior to Covid.

## 2018-11-01 NOTE — Assessment & Plan Note (Addendum)
Chronic, stable.  Denies SI/HI.  Continue current medication regimen and adjust as needed.  Monitor NA+ level.

## 2018-11-01 NOTE — Progress Notes (Signed)
BP 97/64   Pulse 76   Temp 98.7 F (37.1 C) (Oral)   SpO2 97%    Subjective:    Patient ID: Katrina Curtis, female    DOB: 09/16/1960, 58 y.o.   MRN: [id number]  HPI: Katrina Curtis is a 58 y.o. female  Chief Complaint  Patient presents with  . Depression  . Hyperlipidemia  . Hypertension  . Medication Refill    pt states she would like to start back on gabapentin 300 mg   HYPERTENSION / HYPERLIPIDEMIA Currently taking Lisinopril-HCTZ and Atorvastatin. Continues to smoke 1 PPD.  Not interested in quitting smoking. Satisfied with current treatment? yes Duration of hypertension: chronic BP monitoring frequency: not checking BP range:  BP medication side effects: no Duration of hyperlipidemia: chronic Cholesterol medication side effects: no Cholesterol supplements: none Medication compliance: good compliance Aspirin: have recommended to take Recent stressors: no Recurrent headaches: no Visual changes: no Palpitations: no Dyspnea: no Chest pain: no Lower extremity edema: no Dizzy/lightheaded: no   DEPRESSION Continues on Sertraline 200 MG daily. Mood status: stable Satisfied with current treatment?: yes Symptom severity: mild  Duration of current treatment : chronic Side effects: no Medication compliance: good compliance Psychotherapy/counseling: none Depressed mood: no Anxious mood: yes Anhedonia: no Significant weight loss or gain: no Insomnia: yes hard to fall asleep Fatigue: no Feelings of worthlessness or guilt: no Impaired concentration/indecisiveness: no Suicidal ideations: no Hopelessness: no Crying spells: no Depression screen [facility] 2/9 11/01/2018 07/29/2018 03/30/2018 02/24/2018  Decreased Interest 1 3 0 2  Down, Depressed, Hopeless 1 0 0 1  PHQ - 2 Score 2 3 0 3  Altered sleeping 2 0 1 3  Tired, decreased energy 3 3 3 3   Change in appetite 1 0 0 0  Feeling bad or failure about yourself  1 0 0 3  Trouble concentrating 0 0 0 0   Moving slowly or fidgety/restless 1 0 0 3  Suicidal thoughts 0 0 0 0  PHQ-9 Score 10 6 4 15   Difficult doing work/chores Not difficult at all - Not difficult at all Somewhat difficult   CHRONIC PAIN: Previously was taking Gabapentin for back pain and would like to start taking this again.  Pill bottle states 300 MG QHS.  Reports that worked well for her and decreased her pain.    HYPOTHYROIDISM Continues on Levothyroxine 175 MCG.  Last TSH in January 0.559, T4 6.5. Thyroid control status:stable Satisfied with current treatment? yes Medication side effects: no Medication compliance: good compliance Etiology of hypothyroidism: unknown Recent dose adjustment:no Fatigue: no Cold intolerance: no Heat intolerance: no Weight gain: no Weight loss: no Constipation: no Diarrhea/loose stools: no Palpitations: no Lower extremity edema: no Anxiety/depressed mood: no   Relevant past medical, surgical, family and social history reviewed and updated as indicated. Interim medical history since our last visit reviewed. Allergies and medications reviewed and updated.  Review of Systems  Constitutional: Negative for activity change, appetite change, diaphoresis, fatigue and fever.  Respiratory: Negative for cough, chest tightness and shortness of breath.   Cardiovascular: Negative for chest pain, palpitations and leg swelling.  Gastrointestinal: Negative for abdominal distention, abdominal pain, constipation, diarrhea, nausea and vomiting.  Endocrine: Negative for cold intolerance and heat intolerance.  Neurological: Negative for dizziness, syncope, weakness, light-headedness, numbness and headaches.  Psychiatric/Behavioral: Positive for decreased concentration and sleep disturbance. Negative for self-injury and suicidal ideas. The patient is nervous/anxious.     Per HPI unless specifically indicated above  Objective:    BP 97/64   Pulse 76   Temp 98.7 F (37.1 C) (Oral)   SpO2 97%    Wt Readings from Last 3 Encounters:  07/29/18 236 lb (107 kg)  05/11/18 234 lb (106.1 kg)  04/30/18 239 lb 12.8 oz (108.8 kg)    Physical Exam Vitals signs and nursing note reviewed.  Constitutional:      General: She is awake. She is not in acute distress.    Appearance: She is well-developed. She is not ill-appearing.  HENT:     Head: Normocephalic.     Right Ear: Hearing normal.     Left Ear: Hearing normal.     Nose: Nose normal.     Mouth/Throat:     Mouth: Mucous membranes are moist.  Eyes:     General: Lids are normal.        Right eye: No discharge.        Left eye: No discharge.     Conjunctiva/sclera: Conjunctivae normal.     Pupils: Pupils are equal, round, and reactive to light.  Neck:     Musculoskeletal: Normal range of motion and neck supple.     Thyroid: No thyromegaly.     Vascular: No carotid bruit.  Cardiovascular:     Rate and Rhythm: Normal rate and regular rhythm.     Heart sounds: Normal heart sounds. No murmur. No gallop.   Pulmonary:     Effort: Pulmonary effort is normal. No accessory muscle usage or respiratory distress.     Breath sounds: Normal breath sounds.  Abdominal:     General: Bowel sounds are normal.     Palpations: Abdomen is soft. There is no hepatomegaly or splenomegaly.  Musculoskeletal:     Right lower leg: No edema.     Left lower leg: No edema.  Lymphadenopathy:     Cervical: No cervical adenopathy.  Skin:    General: Skin is warm and dry.  Neurological:     Mental Status: She is alert and oriented to person, place, and time.  Psychiatric:        Attention and Perception: Attention normal.        Mood and Affect: Mood normal.        Behavior: Behavior normal. Behavior is cooperative.        Thought Content: Thought content normal.        Judgment: Judgment normal.     Results for orders placed or performed in visit on 09/27/18  Novel Coronavirus, NAA ([facility])  Result Value Ref Range   SARS-CoV-2, NAA Not Detected  Not Detected      Assessment & Plan:   Problem List Items Addressed This Visit      Cardiovascular and Mediastinum   HTN (hypertension), benign    Chronic, stable with tight control.  Will discontinue HCTZ, patient with no edema and chronic pain issues, will continue Lisinopril only at 20 MG daily.  Script sent.  Labs today.  Return in 4 weeks to for follow-up med change.      Relevant Medications   lisinopril (ZESTRIL) 20 MG tablet   Other Relevant Orders   Thyroid Panel With TSH   CAD (coronary artery disease)    Recommend continuing Atorvastatin and taking a low dose, ASA chewable 81 MG daily.      Relevant Medications   lisinopril (ZESTRIL) 20 MG tablet     Endocrine   Hypothyroid    Chronic, ongoing.  Continue current medication  regimen and adjust as needed.  Thyroid labs today.          Other   Nicotine dependence, cigarettes, uncomplicated    I have recommended complete cessation of tobacco use. I have discussed various options available for assistance with tobacco cessation including over the counter methods (Nicotine gum, patch and lozenges). We also discussed prescription options (Chantix, Nicotine Inhaler / Nasal Spray). The patient is not interested in pursuing any prescription tobacco cessation options at this time.      Chronic bilateral low back pain    Chronic, ongoing.  Will restart Gabapentin, script sent.  Patient followed by ortho, Dr. Candelaria Stagers, and was doing PT prior to Covid.      Relevant Orders   VITAMIN D 25 Hydroxy (Vit-D Deficiency, Fractures)   Depression, recurrent (HCC) - Primary    Chronic, stable.  Denies SI/HI.  Continue current medication regimen and adjust as needed.  Monitor NA+ level.        Mixed hyperlipidemia    Chronic, ongoing.  Continue current medication regimen and adjust as needed.  Lipid panel and CMP today.        Relevant Medications   lisinopril (ZESTRIL) 20 MG tablet   Other Relevant Orders   Comprehensive metabolic  panel   Lipid Panel w/o Chol/HDL Ratio    Other Visit Diagnoses    Eye exam, routine       Relevant Orders   Ambulatory referral to Ophthalmology       Follow up plan: Return in about 4 weeks (around 11/29/2018) for HTN.

## 2018-11-02 LAB — COMPREHENSIVE METABOLIC PANEL
ALT: 10 IU/L (ref 0–32)
AST: 13 IU/L (ref 0–40)
Albumin/Globulin Ratio: 1.6 (ref 1.2–2.2)
Albumin: 4.3 g/dL (ref 3.8–4.9)
Alkaline Phosphatase: 65 IU/L (ref 39–117)
BUN/Creatinine Ratio: 28 — ABNORMAL HIGH (ref 9–23)
BUN: 32 mg/dL — ABNORMAL HIGH (ref 6–24)
Bilirubin Total: 0.5 mg/dL (ref 0.0–1.2)
CO2: 20 mmol/L (ref 20–29)
Calcium: 9.1 mg/dL (ref 8.7–10.2)
Chloride: 102 mmol/L (ref 96–106)
Creatinine, Ser: 1.14 mg/dL — ABNORMAL HIGH (ref 0.57–1.00)
GFR calc Af Amer: 62 mL/min/{1.73_m2} (ref >59)
GFR calc non Af Amer: 54 mL/min/{1.73_m2} — ABNORMAL LOW (ref >59)
Globulin, Total: 2.7 g/dL (ref 1.5–4.5)
Glucose: 83 mg/dL (ref 65–99)
Potassium: 4 mmol/L (ref 3.5–5.2)
Sodium: 141 mmol/L (ref 134–144)
Total Protein: 7 g/dL (ref 6.0–8.5)

## 2018-11-02 LAB — THYROID PANEL WITH TSH
Free Thyroxine Index: 1.9 (ref 1.2–4.9)
T3 Uptake Ratio: 30 % (ref 24–39)
T4, Total: 6.4 ug/dL (ref 4.5–12.0)
TSH: 1.7 u[IU]/mL (ref 0.450–4.500)

## 2018-11-02 LAB — LIPID PANEL W/O CHOL/HDL RATIO
Cholesterol, Total: 187 mg/dL (ref 100–199)
HDL: 45 mg/dL (ref >39)
LDL Calculated: 109 mg/dL — ABNORMAL HIGH (ref 0–99)
Triglycerides: 166 mg/dL — ABNORMAL HIGH (ref 0–149)
VLDL Cholesterol Cal: 33 mg/dL (ref 5–40)

## 2018-11-02 LAB — VITAMIN D 25 HYDROXY (VIT D DEFICIENCY, FRACTURES): Vit D, 25-Hydroxy: 17.9 ng/mL — ABNORMAL LOW (ref 30.0–100.0)

## 2018-11-28 ENCOUNTER — Other Ambulatory Visit: Payer: Self-pay | Admitting: Nurse Practitioner

## 2018-11-29 ENCOUNTER — Encounter: Payer: Self-pay | Admitting: Nurse Practitioner

## 2018-11-29 ENCOUNTER — Ambulatory Visit: Payer: Medicaid Other | Admitting: Nurse Practitioner

## 2018-11-29 ENCOUNTER — Other Ambulatory Visit: Payer: Self-pay

## 2018-11-29 VITALS — BP 107/72 | HR 72 | Temp 98.6°F

## 2018-11-29 DIAGNOSIS — F17210 Nicotine dependence, cigarettes, uncomplicated: Secondary | ICD-10-CM

## 2018-11-29 DIAGNOSIS — M25561 Pain in right knee: Secondary | ICD-10-CM | POA: Diagnosis not present

## 2018-11-29 DIAGNOSIS — I10 Essential (primary) hypertension: Secondary | ICD-10-CM | POA: Diagnosis not present

## 2018-11-29 NOTE — Assessment & Plan Note (Signed)
Has underlying chronic pain issues, back and hips.  Will place referral to ortho per request.  Recommend cutting back on Ibuprofen and Meloxicam use at home and using Tylenol and OTC creams, like Voltaren gel.  Ice as needed to knee and wear ACE wrap or sleeve for support.  Will obtain imaging.  Return if worsening or continued discomfort.

## 2018-11-29 NOTE — Assessment & Plan Note (Signed)
Chronic, stable.  BP on lower side, but improved from previous.  Will continue Lisinopril 20 MG daily at this time for kidney protection, consider reduction if lower readings and symptomatic.  Recommend complete cessation smoking.  Return in 6 months.

## 2018-11-29 NOTE — Telephone Encounter (Signed)
Patient seen today and has appointment in March.

## 2018-11-29 NOTE — Assessment & Plan Note (Signed)
I have recommended complete cessation of tobacco use. I have discussed various options available for assistance with tobacco cessation including over the counter methods (Nicotine gum, patch and lozenges). We also discussed prescription options (Chantix, Nicotine Inhaler / Nasal Spray). The patient is not interested in pursuing any prescription tobacco cessation options at this time.  

## 2018-11-29 NOTE — Progress Notes (Signed)
BP 107/72   Pulse 72   Temp 98.6 F (37 C) (Oral)   SpO2 97%    Subjective:    Patient ID: Katrina Curtis, female    DOB: 1960/04/08, 58 y.o.   MRN: [id number]  HPI: Katrina Curtis is a 58 y.o. female  Chief Complaint  Patient presents with  . Hypertension    4 week f/up   HYPERTENSION WITH CKD At last visit discontinued HCTZ due to hypotensive BP readings.  She continues on Lisinopril at 20 MG daily at this time for kidney protection with her CKD.  Continues to smoke 1/2 PPD and is not interested in quitting at this time. Hypertension status: stable  Satisfied with current treatment? yes Duration of hypertension: chronic BP monitoring frequency:  not checking BP range:  BP medication side effects:  no Medication compliance: good compliance Previous BP meds: HCTZ and lisinopril Aspirin: yes Recurrent headaches: no Visual changes: no Palpitations: no Dyspnea: no Chest pain: no Lower extremity edema: no Dizzy/lightheaded: no   KNEE PAIN Reports right knee, states it has been going on for 3-4 months.  Figured it was arthritis as it has felt worse on "rainy days and when moisture in air".  Has had knee pain to this knee before, but not this bad.  Currently taking Meloxicam every day and Tylenol as needed.  Wishes to see orthopedics.   Duration: months Involved knee: right Mechanism of injury: unknown Location:diffuse Onset: gradual Severity: 7/10  Quality:  dull and aching Frequency: constant Radiation: no Aggravating factors: weight bearing, walking, bending and movement  Alleviating factors: APAP and NSAIDs  Status: fluctuating Treatments attempted: APAP and ibuprofen  Relief with NSAIDs?:  mild Weakness with weight bearing or walking: no Sensation of giving way: no Locking: no Popping: no Bruising: no Swelling: yes Redness: no Paresthesias/decreased sensation: no Fevers: no  Relevant past medical, surgical, family and social history reviewed  and updated as indicated. Interim medical history since our last visit reviewed. Allergies and medications reviewed and updated.  Review of Systems  Per HPI unless specifically indicated above     Objective:    BP 107/72   Pulse 72   Temp 98.6 F (37 C) (Oral)   SpO2 97%   Wt Readings from Last 3 Encounters:  07/29/18 236 lb (107 kg)  05/11/18 234 lb (106.1 kg)  04/30/18 239 lb 12.8 oz (108.8 kg)    Physical Exam Vitals signs and nursing note reviewed.  Constitutional:      General: She is awake. She is not in acute distress.    Appearance: She is well-developed. She is not ill-appearing.  HENT:     Head: Normocephalic.     Right Ear: Hearing normal.     Left Ear: Hearing normal.  Eyes:     General: Lids are normal.        Right eye: No discharge.        Left eye: No discharge.     Conjunctiva/sclera: Conjunctivae normal.     Pupils: Pupils are equal, round, and reactive to light.  Neck:     Musculoskeletal: Normal range of motion and neck supple.     Vascular: No carotid bruit.  Cardiovascular:     Rate and Rhythm: Normal rate and regular rhythm.     Heart sounds: Normal heart sounds. No murmur. No gallop.   Pulmonary:     Effort: Pulmonary effort is normal. No accessory muscle usage or respiratory distress.  Breath sounds: Normal breath sounds.  Abdominal:     General: Bowel sounds are normal.     Palpations: Abdomen is soft.  Musculoskeletal:     Right knee: She exhibits swelling, ecchymosis (small bruise present to medial/anterior aspect) and bony tenderness. She exhibits normal range of motion, no effusion, no laceration and no erythema. No tenderness found.     Left knee: She exhibits normal range of motion, no swelling, no deformity, no laceration, no erythema and no bony tenderness. No tenderness found.     Right lower leg: No edema.     Left lower leg: No edema.  Lymphadenopathy:     Cervical: No cervical adenopathy.  Skin:    General: Skin is warm  and dry.  Neurological:     Mental Status: She is alert and oriented to person, place, and time.  Psychiatric:        Attention and Perception: Attention normal.        Mood and Affect: Mood normal.        Behavior: Behavior normal. Behavior is cooperative.        Thought Content: Thought content normal.        Judgment: Judgment normal.     Results for orders placed or performed in visit on 11/01/18  Thyroid Panel With TSH  Result Value Ref Range   TSH 1.700 0.450 - 4.500 uIU/mL   T4, Total 6.4 4.5 - 12.0 ug/dL   T3 Uptake Ratio 30 24 - 39 %   Free Thyroxine Index 1.9 1.2 - 4.9  Comprehensive metabolic panel  Result Value Ref Range   Glucose 83 65 - 99 mg/dL   BUN 32 (H) 6 - 24 mg/dL   Creatinine, Ser 1.14 (H) 0.57 - 1.00 mg/dL   GFR calc non Af Amer 54 (L) >59 mL/min/1.73   GFR calc Af Amer 62 >59 mL/min/1.73   BUN/Creatinine Ratio 28 (H) 9 - 23   Sodium 141 134 - 144 mmol/L   Potassium 4.0 3.5 - 5.2 mmol/L   Chloride 102 96 - 106 mmol/L   CO2 20 20 - 29 mmol/L   Calcium 9.1 8.7 - 10.2 mg/dL   Total Protein 7.0 6.0 - 8.5 g/dL   Albumin 4.3 3.8 - 4.9 g/dL   Globulin, Total 2.7 1.5 - 4.5 g/dL   Albumin/Globulin Ratio 1.6 1.2 - 2.2   Bilirubin Total 0.5 0.0 - 1.2 mg/dL   Alkaline Phosphatase 65 39 - 117 IU/L   AST 13 0 - 40 IU/L   ALT 10 0 - 32 IU/L  Lipid Panel w/o Chol/HDL Ratio  Result Value Ref Range   Cholesterol, Total 187 100 - 199 mg/dL   Triglycerides 166 (H) 0 - 149 mg/dL   HDL 45 >39 mg/dL   VLDL Cholesterol Cal 33 5 - 40 mg/dL   LDL Calculated 109 (H) 0 - 99 mg/dL  VITAMIN D 25 Hydroxy (Vit-D Deficiency, Fractures)  Result Value Ref Range   Vit D, 25-Hydroxy 17.9 (L) 30.0 - 100.0 ng/mL      Assessment & Plan:   Problem List Items Addressed This Visit      Cardiovascular and Mediastinum   HTN (hypertension), benign - Primary    Chronic, stable.  BP on lower side, but improved from previous.  Will continue Lisinopril 20 MG daily at this time for  kidney protection, consider reduction if lower readings and symptomatic.  Recommend complete cessation smoking.  Return in 6 months.  Other   Nicotine dependence, cigarettes, uncomplicated    I have recommended complete cessation of tobacco use. I have discussed various options available for assistance with tobacco cessation including over the counter methods (Nicotine gum, patch and lozenges). We also discussed prescription options (Chantix, Nicotine Inhaler / Nasal Spray). The patient is not interested in pursuing any prescription tobacco cessation options at this time.      Acute pain of right knee    Has underlying chronic pain issues, back and hips.  Will place referral to ortho per request.  Recommend cutting back on Ibuprofen and Meloxicam use at home and using Tylenol and OTC creams, like Voltaren gel.  Ice as needed to knee and wear ACE wrap or sleeve for support.  Will obtain imaging.  Return if worsening or continued discomfort.      Relevant Orders   DG Knee Complete 4 Views Right       Follow up plan: Return in about 6 months (around 05/29/2019) for HTN/HLD, Hypothyroid, Mood.

## 2018-11-29 NOTE — Patient Instructions (Addendum)
Take Vitamin D3 1000 units daily.   Knee imaging location: [facility] [address] [place], [place] [address] ---  [phone]   Acute Knee Pain, Adult Many things can cause knee pain. Sometimes, knee pain is sudden (acute) and may be caused by damage, swelling, or irritation of the muscles and tissues that support your knee. The pain often goes away on its own with time and rest. If the pain does not go away, tests may be done to find out what is causing the pain. Follow these instructions at home: Pay attention to any changes in your symptoms. Take these actions to relieve your pain. If you have a knee sleeve or brace:   Wear the sleeve or brace as told by your doctor. Remove it only as told by your doctor.  Loosen the sleeve or brace if your toes: ? Tingle. ? Become numb. ? Turn cold and blue.  Keep the sleeve or brace clean.  If the sleeve or brace is not waterproof: ? Do not let it get wet. ? Cover it with a watertight covering when you take a bath or shower. Activity  Rest your knee.  Do not do things that cause pain.  Avoid activities where both feet leave the ground at the same time (high-impact activities). Examples are running, jumping rope, and doing jumping jacks.  Work with a physical therapist to make a safe exercise program, as told by your doctor. Managing pain, stiffness, and swelling   If told, put ice on the knee: ? Put ice in a plastic bag. ? Place a towel between your skin and the bag. ? Leave the ice on for 20 minutes, 2-3 times a day.  If told, put pressure (compression) on your injured knee to control swelling, give support, and help with discomfort. Compression may be done with an elastic bandage. General instructions  Take all medicines only as told by your doctor.  Raise (elevate) your knee while you are sitting or lying down. Make sure your knee is higher than your heart.  Sleep with a pillow under your knee.  Do not use any products  that contain nicotine or tobacco. These include cigarettes, e-cigarettes, and chewing tobacco. These products may slow down healing. If you need help quitting, ask your doctor.  If you are overweight, work with your doctor and a food expert (dietitian) to set goals to lose weight. Being overweight can make your knee hurt more.  Keep all follow-up visits as told by your doctor. This is important. Contact a doctor if:  The knee pain does not stop.  The knee pain changes or gets worse.  You have a fever along with knee pain.  Your knee feels warm when you touch it.  Your knee gives out or locks up. Get help right away if:  Your knee swells, and the swelling gets worse.  You cannot move your knee.  You have very bad knee pain. Summary  Many things can cause knee pain. The pain often goes away on its own with time and rest.  Your doctor may do tests to find out the cause of the pain.  Pay attention to any changes in your symptoms. Relieve your pain with rest, medicines, light activity, and use of ice.  Get help right away if you cannot move your knee or your knee pain is very bad. This information is not intended to replace advice given to you by your health care provider. Make sure you discuss any questions  you have with your health care provider. Document Released: 06/13/2008 Document Revised: 08/27/2017 Document Reviewed: 08/27/2017 Elsevier Patient Education  2020 [employer].

## 2018-12-20 ENCOUNTER — Ambulatory Visit
Admission: RE | Admit: 2018-12-20 | Discharge: 2018-12-20 | Disposition: A | Discharge status: Home / Self Care | Payer: Medicaid Other | Source: Ambulatory Visit | Attending: Nurse Practitioner | Admitting: Nurse Practitioner

## 2018-12-20 ENCOUNTER — Other Ambulatory Visit: Payer: Self-pay

## 2018-12-20 DIAGNOSIS — M25861 Other specified joint disorders, right knee: Secondary | ICD-10-CM | POA: Diagnosis not present

## 2018-12-20 DIAGNOSIS — M25561 Pain in right knee: Secondary | ICD-10-CM | POA: Insufficient documentation

## 2018-12-20 DIAGNOSIS — M25461 Effusion, right knee: Secondary | ICD-10-CM | POA: Diagnosis not present

## 2018-12-21 ENCOUNTER — Other Ambulatory Visit: Payer: Self-pay | Admitting: Nurse Practitioner

## 2018-12-21 DIAGNOSIS — M25561 Pain in right knee: Secondary | ICD-10-CM

## 2018-12-21 DIAGNOSIS — G8929 Other chronic pain: Secondary | ICD-10-CM

## 2018-12-21 NOTE — Progress Notes (Signed)
Ortho referral per patient request for chronic right knee pain with noted joint space narrowing.

## 2018-12-22 DIAGNOSIS — H353132 Nonexudative age-related macular degeneration, bilateral, intermediate dry stage: Secondary | ICD-10-CM | POA: Diagnosis not present

## 2018-12-26 ENCOUNTER — Other Ambulatory Visit: Payer: Self-pay | Admitting: Family Medicine

## 2019-01-21 ENCOUNTER — Other Ambulatory Visit: Payer: Self-pay | Admitting: Nurse Practitioner

## 2019-03-20 ENCOUNTER — Other Ambulatory Visit: Payer: Self-pay | Admitting: Nurse Practitioner

## 2019-03-29 ENCOUNTER — Other Ambulatory Visit: Payer: Self-pay | Admitting: Nurse Practitioner

## 2019-05-09 ENCOUNTER — Telehealth: Payer: Self-pay | Admitting: *Deleted

## 2019-05-09 NOTE — Telephone Encounter (Signed)
Attempted to contact about lung screening scan being due. However there is no answer or voicemail option.

## 2019-05-12 ENCOUNTER — Telehealth: Payer: Self-pay | Admitting: *Deleted

## 2019-05-12 NOTE — Telephone Encounter (Signed)
Attempted to contact to schedule lung screening scan, however there is no answer or voicemail option.

## 2019-05-23 ENCOUNTER — Telehealth: Payer: Self-pay | Admitting: Nurse Practitioner

## 2019-05-23 ENCOUNTER — Other Ambulatory Visit: Payer: Self-pay | Admitting: Nurse Practitioner

## 2019-05-23 NOTE — Telephone Encounter (Signed)
Company notified.

## 2019-05-23 NOTE — Telephone Encounter (Signed)
Copied from [place] [id number]. Topic: General - Inquiry >> May 23, 2019  9:50 AM Virl Axe D wrote: Reason for CRM: [facility] stated they need a causing diagnosis for pt's incontinence. Pt is wanting to order incontinence supplies from them. Please advise.

## 2019-05-30 ENCOUNTER — Ambulatory Visit: Payer: Medicaid Other | Admitting: Nurse Practitioner

## 2019-06-01 DIAGNOSIS — N3946 Mixed incontinence: Secondary | ICD-10-CM | POA: Diagnosis not present

## 2019-06-01 DIAGNOSIS — N3281 Overactive bladder: Secondary | ICD-10-CM | POA: Diagnosis not present

## 2019-06-10 ENCOUNTER — Telehealth: Payer: Self-pay | Admitting: *Deleted

## 2019-06-10 NOTE — Telephone Encounter (Signed)
(  06/10/19) Left message for pt to notify them that it is time to schedule annual low dose lung cancer screening CT scan. Instructed patient to call back to verify information prior to the scan being scheduled SRW

## 2019-06-21 ENCOUNTER — Other Ambulatory Visit: Payer: Self-pay | Admitting: Family Medicine

## 2019-07-19 DIAGNOSIS — N3946 Mixed incontinence: Secondary | ICD-10-CM | POA: Diagnosis not present

## 2019-07-19 DIAGNOSIS — N3281 Overactive bladder: Secondary | ICD-10-CM | POA: Diagnosis not present

## 2019-07-28 ENCOUNTER — Other Ambulatory Visit: Payer: Self-pay | Admitting: Nurse Practitioner

## 2019-07-28 NOTE — Telephone Encounter (Signed)
Routing to provider  

## 2019-07-29 ENCOUNTER — Encounter: Payer: Self-pay | Admitting: Nurse Practitioner

## 2019-07-29 NOTE — Telephone Encounter (Signed)
Number disconnected.

## 2019-07-29 NOTE — Telephone Encounter (Signed)
Unable to Lvm due to disconnected number , sent letter.

## 2019-07-29 NOTE — Telephone Encounter (Signed)
Tried to call patient, number has been disconnected.

## 2019-08-09 ENCOUNTER — Ambulatory Visit: Payer: Medicaid Other | Admitting: Nurse Practitioner

## 2019-08-09 ENCOUNTER — Encounter: Payer: Self-pay | Admitting: Nurse Practitioner

## 2019-08-09 ENCOUNTER — Other Ambulatory Visit: Payer: Self-pay

## 2019-08-09 VITALS — BP 126/79 | HR 73 | Temp 98.2°F | Ht 67.0 in | Wt 231.6 lb

## 2019-08-09 DIAGNOSIS — N183 Chronic kidney disease, stage 3 unspecified: Secondary | ICD-10-CM | POA: Insufficient documentation

## 2019-08-09 DIAGNOSIS — N1831 Chronic kidney disease, stage 3a: Secondary | ICD-10-CM | POA: Diagnosis not present

## 2019-08-09 DIAGNOSIS — F339 Major depressive disorder, recurrent, unspecified: Secondary | ICD-10-CM | POA: Diagnosis not present

## 2019-08-09 DIAGNOSIS — E039 Hypothyroidism, unspecified: Secondary | ICD-10-CM

## 2019-08-09 DIAGNOSIS — I10 Essential (primary) hypertension: Secondary | ICD-10-CM | POA: Diagnosis not present

## 2019-08-09 DIAGNOSIS — I700 Atherosclerosis of aorta: Secondary | ICD-10-CM | POA: Diagnosis not present

## 2019-08-09 DIAGNOSIS — E782 Mixed hyperlipidemia: Secondary | ICD-10-CM | POA: Diagnosis not present

## 2019-08-09 DIAGNOSIS — F17210 Nicotine dependence, cigarettes, uncomplicated: Secondary | ICD-10-CM

## 2019-08-09 DIAGNOSIS — J432 Centrilobular emphysema: Secondary | ICD-10-CM

## 2019-08-09 DIAGNOSIS — Z1211 Encounter for screening for malignant neoplasm of colon: Secondary | ICD-10-CM

## 2019-08-09 DIAGNOSIS — N3281 Overactive bladder: Secondary | ICD-10-CM

## 2019-08-09 DIAGNOSIS — Z6837 Body mass index (BMI) 37.0-37.9, adult: Secondary | ICD-10-CM

## 2019-08-09 MED ORDER — ATORVASTATIN CALCIUM 10 MG PO TABS: 10.0000 mg | ORAL_TABLET | Freq: Every day | ORAL | 4 refills | Status: DC

## 2019-08-09 MED ORDER — LEVOTHYROXINE SODIUM 175 MCG PO TABS: 175.0000 ug | ORAL_TABLET | Freq: Every day | ORAL | 4 refills | Status: DC

## 2019-08-09 MED ORDER — ALBUTEROL SULFATE HFA 108 (90 BASE) MCG/ACT IN AERS: 1.0000 | INHALATION_SPRAY | Freq: Four times a day (QID) | RESPIRATORY_TRACT | 3 refills | Status: DC | PRN

## 2019-08-09 MED ORDER — OXYBUTYNIN CHLORIDE ER 10 MG PO TB24: 10.0000 mg | ORAL_TABLET | Freq: Every day | ORAL | 3 refills | Status: DC

## 2019-08-09 NOTE — Assessment & Plan Note (Signed)
Chronic, ongoing with some worsening.  Will trial increase in Oxybutynin to 10 MG XL tablet daily, script sent.  UA today 1+ LEUK only.  Recommend complete cessation of smoking and educated on diet changes to assist with symptoms.  Referral to urology placed for further assessment and recommendations due to ongoing symptoms with some worsening.  Return in 6 weeks for follow-up.

## 2019-08-09 NOTE — Assessment & Plan Note (Signed)
Chronic, stable.  Denies SI/HI.  Continue current medication regimen and adjust as needed.  Monitor NA+ level, BMP today.

## 2019-08-09 NOTE — Assessment & Plan Note (Addendum)
Chronic, ongoing.  No recent exacerbations.  Continue current medication regimen and adjust as needed, refills on Albuterol sent. Spirometry next visit for yearly exam.  Recommend she schedule her yearly CT lung screening + recommend complete cessation of smoking. 

## 2019-08-09 NOTE — Progress Notes (Signed)
BP 126/79   Pulse 73   Temp 98.2 F (36.8 C) (Oral)   Ht 5\' 7"  (1.702 m)   Wt 231 lb 9.6 oz (105.1 kg)   SpO2 97%   BMI 36.27 kg/m    Subjective:    Patient ID: Katrina Curtis, female    DOB: 22-Sep-1960, 59 y.o.   MRN: [id number]  HPI: Katrina Curtis is a 59 y.o. female  Chief Complaint  Patient presents with  . Depression  . Hypertension  . Hyperlipidemia  . Urinary Incontinence    pt states she has been having incontinence issues for a while now    HYPERTENSION/HLD WITH CKD At last visit discontinued HCTZ due to hypotensive BP readings and overactive bladder.  She continues on Lisinopril at 20 MG daily at this time for kidney protection with her CKD.  Takes Atorvastatin for HLD, although has not taken in months. Hypertension status: stable  Satisfied with current treatment? yes Duration of hypertension: chronic BP monitoring frequency:  not checking BP range:  BP medication side effects:  no Medication compliance: good compliance Previous BP meds: HCTZ and lisinopril Aspirin: yes Recurrent headaches: no Visual changes: no Palpitations: no Dyspnea: no Chest pain: no Lower extremity edema: no Dizzy/lightheaded: no   CHRONIC KIDNEY DISEASE Last labs in August 2020 noted CRT 1.14 and GFR 54.   CKD status: stable Medications renally dose: yes Previous renal evaluation: no Pneumovax:  Not applicable Influenza Vaccine:  Up to Date  COPD Currently continues to smoke about 1 PPD.  Continues Symbicort and Albuterol.  Has smoked since she was about 16.  Last CT scan of lungs in February 2020. COPD status: stable Satisfied with current treatment?: yes Oxygen use: no Dyspnea frequency: none Cough frequency: none Rescue inhaler frequency:   Limitation of activity: no Productive cough: none Last Spirometry: 04/30/2018 Pneumovax: Not up to Date Influenza: Up to Date  HYPOTHYROIDISM Continues on Levothyroxine 175 MCG. Thyroid control  status:stable Satisfied with current treatment? yes Medication side effects: no Medication compliance: good compliance Etiology of hypothyroidism:  Recent dose adjustment:no Fatigue: no Cold intolerance: no Heat intolerance: no Weight gain: no Weight loss: no Constipation: no Diarrhea/loose stools: no Palpitations: no Lower extremity edema: no Anxiety/depressed mood: no  DEPRESSION Continues on Sertraline 200 MG daily. Mood status: stable Satisfied with current treatment?: yes Symptom severity: mild  Duration of current treatment : chronic Side effects: no Medication compliance: good compliance Psychotherapy/counseling: none Depressed mood: no Anxious mood: yes Anhedonia: no Significant weight loss or gain: no Insomnia: yes hard to fall asleep Fatigue: no Feelings of worthlessness or guilt: no Impaired concentration/indecisiveness: no Suicidal ideations: no Hopelessness: no Crying spells: no Depression screen [facility] 2/9 08/09/2019 11/01/2018 07/29/2018 03/30/2018 02/24/2018  Decreased Interest 0 1 3 0 2  Down, Depressed, Hopeless 0 1 0 0 1  PHQ - 2 Score 0 2 3 0 3  Altered sleeping 0 2 0 1 3  Tired, decreased energy 1 3 3 3 3   Change in appetite 1 1 0 0 0  Feeling bad or failure about yourself  0 1 0 0 3  Trouble concentrating 0 0 0 0 0  Moving slowly or fidgety/restless 0 1 0 0 3  Suicidal thoughts 0 0 0 0 0  PHQ-9 Score 2 10 6 4 15   Difficult doing work/chores Not difficult at all Not difficult at all - Not difficult at all Somewhat difficult    OVERACTIVE BLADDER: Started having issues 34 years ago after  birth of her child.  Was started on Oxybutynin 03/30/18 and this initially helped, but over past months it has started to get worse.  Reports she can not buy enough underwear.  Reports that the urine "flows out", tries to run to bathroom and does not make it.  Has about 3-5 episodes of incontinence a day due to being unable to make it to bathroom + at night will wet  her bed.  Is using pads and briefs.   Denies frequency, dysuria, urgency, abdominal pain, or fever.   Dysuria: no Urinary frequency: yes Urgency: yes Small volume voids: no Urinary incontinence: yes Foul odor: no Hematuria: no Abdominal pain: no Back pain: no Suprapubic pain/pressure: no Flank pain: no Fever:  no Vomiting: no  Relevant past medical, surgical, family and social history reviewed and updated as indicated. Interim medical history since our last visit reviewed. Allergies and medications reviewed and updated.  Review of Systems  Constitutional: Negative for activity change, appetite change, diaphoresis, fatigue and fever.  Respiratory: Negative for cough, chest tightness, shortness of breath and wheezing.   Cardiovascular: Negative for chest pain, palpitations and leg swelling.  Gastrointestinal: Negative.   Endocrine: Negative for cold intolerance and heat intolerance.  Genitourinary: Positive for frequency and urgency. Negative for decreased urine volume, dysuria, hematuria and vaginal discharge.  Neurological: Negative.   Psychiatric/Behavioral: Negative for decreased concentration, self-injury, sleep disturbance and suicidal ideas. The patient is not nervous/anxious.     Per HPI unless specifically indicated above     Objective:    BP 126/79   Pulse 73   Temp 98.2 F (36.8 C) (Oral)   Ht 5\' 7"  (1.702 m)   Wt 231 lb 9.6 oz (105.1 kg)   SpO2 97%   BMI 36.27 kg/m   Wt Readings from Last 3 Encounters:  08/09/19 231 lb 9.6 oz (105.1 kg)  07/29/18 236 lb (107 kg)  05/11/18 234 lb (106.1 kg)    Physical Exam Vitals and nursing note reviewed.  Constitutional:      General: She is awake. She is not in acute distress.    Appearance: She is well-developed and well-groomed. She is obese. She is not ill-appearing.  HENT:     Head: Normocephalic.     Right Ear: Hearing normal.     Left Ear: Hearing normal.  Eyes:     General: Lids are normal.        Right  eye: No discharge.        Left eye: No discharge.     Conjunctiva/sclera: Conjunctivae normal.     Pupils: Pupils are equal, round, and reactive to light.  Neck:     Thyroid: No thyromegaly.     Vascular: No carotid bruit.  Cardiovascular:     Rate and Rhythm: Normal rate and regular rhythm.     Heart sounds: Normal heart sounds. No murmur. No gallop.   Pulmonary:     Effort: Pulmonary effort is normal. No accessory muscle usage or respiratory distress.     Breath sounds: Normal breath sounds.  Abdominal:     General: Bowel sounds are normal.     Palpations: Abdomen is soft. There is no hepatomegaly or splenomegaly.     Tenderness: There is no abdominal tenderness. There is no right CVA tenderness or left CVA tenderness.  Musculoskeletal:     Cervical back: Normal range of motion and neck supple.     Right lower leg: No edema.     Left lower leg:  No edema.  Skin:    General: Skin is warm and dry.  Neurological:     Mental Status: She is alert and oriented to person, place, and time.  Psychiatric:        Attention and Perception: Attention normal.        Mood and Affect: Mood normal.        Speech: Speech normal.        Behavior: Behavior normal. Behavior is cooperative.        Thought Content: Thought content normal.     Results for orders placed or performed in visit on 08/09/19  Microscopic Examination   URINE  Result Value Ref Range   WBC, UA 6-10 (A) 0 - 5 /hpf   RBC None seen 0 - 2 /hpf   Epithelial Cells (non renal) 0-10 0 - 10 /hpf   Casts Present None seen /lpf   Cast Type Hyaline casts N/A   Bacteria, UA None seen None seen/Few  Urine Culture, Reflex   URINE  Result Value Ref Range   Urine Culture, Routine WILL FOLLOW   UA/M w/rflx Culture, Routine   Specimen: Urine   URINE  Result Value Ref Range   Specific Gravity, UA 1.015 1.005 - 1.030   pH, UA 5.0 5.0 - 7.5   Color, UA Yellow Yellow   Appearance Ur Clear Clear   Leukocytes,UA 1+ (A) Negative    Protein,UA Negative Negative/Trace   Glucose, UA Negative Negative   Ketones, UA Negative Negative   RBC, UA Negative Negative   Bilirubin, UA Negative Negative   Urobilinogen, Ur 0.2 0.2 - 1.0 mg/dL   Nitrite, UA Negative Negative   Microscopic Examination See below:    Urinalysis Reflex Comment       Assessment & Plan:   Problem List Items Addressed This Visit      Cardiovascular and Mediastinum   HTN (hypertension), benign    Chronic, stable.  BP below goal on exam.  Will continue Lisinopril 20 MG daily at this time for kidney protection, consider reduction if lower readings and symptomatic.  Recommend complete cessation smoking.  Return in 6 months.      Relevant Medications   atorvastatin (LIPITOR) 10 MG tablet   Other Relevant Orders   Basic metabolic panel   Aortic atherosclerosis ([place])    Noted on past CT lung imaging.  Recommend continued statin use daily + recommend complete cessation of smoking.  Discussed benefit of adding on daily Baby ASA for prevention, recommend adding this on.      Relevant Medications   atorvastatin (LIPITOR) 10 MG tablet     Respiratory   Centrilobular emphysema (HCC) - Primary    Chronic, ongoing.  No recent exacerbations.  Continue current medication regimen and adjust as needed, refills on Albuterol sent. Spirometry next visit for yearly exam.  Recommend she schedule her yearly CT lung screening + recommend complete cessation of smoking.      Relevant Medications   albuterol (VENTOLIN HFA) 108 (90 Base) MCG/ACT inhaler     Endocrine   Hypothyroid    Chronic, ongoing.  Continue current medication regimen and adjust as needed.  Thyroid labs today.        Relevant Medications   levothyroxine (SYNTHROID) 175 MCG tablet   Other Relevant Orders   Thyroid Panel With TSH     Genitourinary   Overactive bladder    Chronic, ongoing with some worsening.  Will trial increase in Oxybutynin to 10 MG XL  tablet daily, script sent.  UA today 1+  LEUK only.  Recommend complete cessation of smoking and educated on diet changes to assist with symptoms.  Referral to urology placed for further assessment and recommendations due to ongoing symptoms with some worsening.  Return in 6 weeks for follow-up.      Relevant Orders   UA/M w/rflx Culture, Routine (Completed)   Ambulatory referral to Urology   CKD (chronic kidney disease) stage 3, GFR 30-59 ml/min    Chronic, stable.  Continue Lisinopril for kidney protection.  BMP today.  If worsening or decline in function consider referral to nephrology.      Relevant Orders   Basic metabolic panel     Other   Obesity    Recommended eating smaller high protein, low fat meals more frequently and exercising 30 mins a day 5 times a week with a goal of 10-15lb weight loss in the next 3 months. Patient voiced their understanding and motivation to adhere to these recommendations.       Nicotine dependence, cigarettes, uncomplicated    I have recommended complete cessation of tobacco use. I have discussed various options available for assistance with tobacco cessation including over the counter methods (Nicotine gum, patch and lozenges). We also discussed prescription options (Chantix, Nicotine Inhaler / Nasal Spray). The patient is not interested in pursuing any prescription tobacco cessation options at this time.  Recommend she schedule her annual CT lung screening.       Depression, recurrent (HCC)    Chronic, stable.  Denies SI/HI.  Continue current medication regimen and adjust as needed.  Monitor NA+ level, BMP today.      Mixed hyperlipidemia    Chronic, ongoing.  Continue current medication regimen and adjust as needed.  Lipid panel today.  Recommend complete cessation of smoking for prevention.      Relevant Medications   atorvastatin (LIPITOR) 10 MG tablet   Other Relevant Orders   Lipid Panel w/o Chol/HDL Ratio    Other Visit Diagnoses    Colon cancer screening       Due for  repeat colonoscopy -- on review to return in one year.   Relevant Orders   Ambulatory referral to Gastroenterology       Follow up plan: Return in about 6 weeks (around 09/20/2019) for overactive bladder.

## 2019-08-09 NOTE — Assessment & Plan Note (Signed)
Recommended eating smaller high protein, low fat meals more frequently and exercising 30 mins a day 5 times a week with a goal of 10-15lb weight loss in the next 3 months. Patient voiced their understanding and motivation to adhere to these recommendations.  

## 2019-08-09 NOTE — Assessment & Plan Note (Signed)
I have recommended complete cessation of tobacco use. I have discussed various options available for assistance with tobacco cessation including over the counter methods (Nicotine gum, patch and lozenges). We also discussed prescription options (Chantix, Nicotine Inhaler / Nasal Spray). The patient is not interested in pursuing any prescription tobacco cessation options at this time.  Recommend she schedule her annual CT lung screening.

## 2019-08-09 NOTE — Assessment & Plan Note (Signed)
Noted on past CT lung imaging.  Recommend continued statin use daily + recommend complete cessation of smoking.  Discussed benefit of adding on daily Baby ASA for prevention, recommend adding this on. 

## 2019-08-09 NOTE — Assessment & Plan Note (Signed)
Chronic, ongoing.  Continue current medication regimen and adjust as needed.  Lipid panel today.  Recommend complete cessation of smoking for prevention. 

## 2019-08-09 NOTE — Assessment & Plan Note (Signed)
Chronic, stable.  BP below goal on exam.  Will continue Lisinopril 20 MG daily at this time for kidney protection, consider reduction if lower readings and symptomatic.  Recommend complete cessation smoking.  Return in 6 months.

## 2019-08-09 NOTE — Assessment & Plan Note (Signed)
Chronic, stable.  Continue Lisinopril for kidney protection.  BMP today.  If worsening or decline in function consider referral to nephrology.

## 2019-08-09 NOTE — Patient Instructions (Addendum)
Expect calls from [facility] for your yearly lung screening, GI for colonoscopy, and urology -- please answer calls for these to schedule.  Overactive Bladder, Adult  Overactive bladder refers to a condition in which a person has a sudden need to pass urine. The person may leak urine if he or she cannot get to the bathroom fast enough (urinary incontinence). A person with this condition may also wake up several times in the night to go to the bathroom. Overactive bladder is associated with poor nerve signals between your bladder and your brain. Your bladder may get the signal to empty before it is full. You may also have very sensitive muscles that make your bladder squeeze too soon. These symptoms might interfere with daily work or social activities. What are the causes? This condition may be associated with or caused by:  Urinary tract infection.  Infection of nearby tissues, such as the prostate.  Prostate enlargement.  Surgery on the uterus or urethra.  Bladder stones, inflammation, or tumors.  Drinking too much caffeine or alcohol.  Certain medicines, especially medicines that get rid of extra fluid in the body (diuretics).  Muscle or nerve weakness, especially from: ? A spinal cord injury. ? Stroke. ? Multiple sclerosis. ? Parkinson's disease.  Diabetes.  Constipation. What increases the risk? You may be at greater risk for overactive bladder if you:  Are an older adult.  Smoke.  Are going through menopause.  Have prostate problems.  Have a neurological disease, such as stroke, dementia, Parkinson's disease, or multiple sclerosis (MS).  Eat or drink things that irritate the bladder. These include alcohol, spicy food, and caffeine.  Are overweight or obese. What are the signs or symptoms? Symptoms of this condition include:  Sudden, strong urge to urinate.  Leaking urine.  Urinating 8 or more times a day.  Waking up to urinate 2 or more times a night. How is  this diagnosed? Your health care provider may suspect overactive bladder based on your symptoms. He or she will diagnose this condition by:  A physical exam and medical history.  Blood or urine tests. You might need bladder or urine tests to help determine what is causing your overactive bladder. You might also need to see a health care provider who specializes in urinary tract problems (urologist). How is this treated? Treatment for overactive bladder depends on the cause of your condition and whether it is mild or severe. You can also make lifestyle changes at home. Options include:  Bladder training. This may include: ? Learning to control the urge to urinate by following a schedule that directs you to urinate at regular intervals (timed voiding). ? Doing Kegel exercises to strengthen your pelvic floor muscles, which support your bladder. Toning these muscles can help you control urination, even if your bladder muscles are overactive.  Special devices. This may include: ? Biofeedback, which uses sensors to help you become aware of your body's signals. ? Electrical stimulation, which uses electrodes placed inside the body (implanted) or outside the body. These electrodes send gentle pulses of electricity to strengthen the nerves or muscles that control the bladder. ? Women may use a plastic device that fits into the vagina and supports the bladder (pessary).  Medicines. ? Antibiotics to treat bladder infection. ? Antispasmodics to stop the bladder from releasing urine at the wrong time. ? Tricyclic antidepressants to relax bladder muscles. ? Injections of botulinum toxin type A directly into the bladder tissue to relax bladder muscles.  Lifestyle changes.  This may include: ? Weight loss. Talk to your health care provider about weight loss methods that would work best for you. ? Diet changes. This may include reducing how much alcohol and caffeine you consume, or drinking fluids at  different times of the day. ? Not smoking. Do not use any products that contain nicotine or tobacco, such as cigarettes and e-cigarettes. If you need help quitting, ask your health care provider.  Surgery. ? A device may be implanted to help manage the nerve signals that control urination. ? An electrode may be implanted to stimulate electrical signals in the bladder. ? A procedure may be done to change the shape of the bladder. This is done only in very severe cases. Follow these instructions at home: Lifestyle  Make any diet or lifestyle changes that are recommended by your health care provider. These may include: ? Drinking less fluid or drinking fluids at different times of the day. ? Cutting down on caffeine or alcohol. ? Doing Kegel exercises. ? Losing weight if needed. ? Eating a healthy and balanced diet to prevent constipation. This may include:  Eating foods that are high in fiber, such as fresh fruits and vegetables, whole grains, and beans.  Limiting foods that are high in fat and processed sugars, such as fried and sweet foods. General instructions  Take over-the-counter and prescription medicines only as told by your health care provider.  If you were prescribed an antibiotic medicine, take it as told by your health care provider. Do not stop taking the antibiotic even if you start to feel better.  Use any implants or pessary as told by your health care provider.  If needed, wear pads to absorb urine leakage.  Keep a journal or log to track how much and when you drink and when you feel the need to urinate. This will help your health care provider monitor your condition.  Keep all follow-up visits as told by your health care provider. This is important. Contact a health care provider if:  You have a fever.  Your symptoms do not get better with treatment.  Your pain and discomfort get worse.  You have more frequent urges to urinate. Get help right away if:  You  are not able to control your bladder. Summary  Overactive bladder refers to a condition in which a person has a sudden need to pass urine.  Several conditions may lead to an overactive bladder.  Treatment for overactive bladder depends on the cause and severity of your condition.  Follow your health care provider's instructions about lifestyle changes, doing Kegel exercises, keeping a journal, and taking medicines. This information is not intended to replace advice given to you by your health care provider. Make sure you discuss any questions you have with your health care provider. Document Revised: 07/08/2018 Document Reviewed: 04/02/2017 Elsevier Patient Education  [place].

## 2019-08-09 NOTE — Assessment & Plan Note (Signed)
Chronic, ongoing.  Continue current medication regimen and adjust as needed.  Thyroid labs today. 

## 2019-08-10 ENCOUNTER — Telehealth: Payer: Self-pay | Admitting: *Deleted

## 2019-08-10 ENCOUNTER — Other Ambulatory Visit: Payer: Self-pay | Admitting: Nurse Practitioner

## 2019-08-10 DIAGNOSIS — Z87891 Personal history of nicotine dependence: Secondary | ICD-10-CM

## 2019-08-10 DIAGNOSIS — E782 Mixed hyperlipidemia: Secondary | ICD-10-CM

## 2019-08-10 DIAGNOSIS — Z122 Encounter for screening for malignant neoplasm of respiratory organs: Secondary | ICD-10-CM

## 2019-08-10 LAB — LIPID PANEL W/O CHOL/HDL RATIO
Cholesterol, Total: 180 mg/dL (ref 100–199)
HDL: 47 mg/dL (ref >39)
LDL Chol Calc (NIH): 94 mg/dL (ref 0–99)
Triglycerides: 229 mg/dL — ABNORMAL HIGH (ref 0–149)
VLDL Cholesterol Cal: 39 mg/dL (ref 5–40)

## 2019-08-10 LAB — BASIC METABOLIC PANEL
BUN/Creatinine Ratio: 20 (ref 9–23)
BUN: 23 mg/dL (ref 6–24)
CO2: 22 mmol/L (ref 20–29)
Calcium: 9.5 mg/dL (ref 8.7–10.2)
Chloride: 105 mmol/L (ref 96–106)
Creatinine, Ser: 1.14 mg/dL — ABNORMAL HIGH (ref 0.57–1.00)
GFR calc Af Amer: 62 mL/min/{1.73_m2} (ref >59)
GFR calc non Af Amer: 54 mL/min/{1.73_m2} — ABNORMAL LOW (ref >59)
Glucose: 104 mg/dL — ABNORMAL HIGH (ref 65–99)
Potassium: 4.5 mmol/L (ref 3.5–5.2)
Sodium: 141 mmol/L (ref 134–144)

## 2019-08-10 LAB — THYROID PANEL WITH TSH
Free Thyroxine Index: 1.3 (ref 1.2–4.9)
T3 Uptake Ratio: 27 % (ref 24–39)
T4, Total: 4.9 ug/dL (ref 4.5–12.0)
TSH: 3.91 u[IU]/mL (ref 0.450–4.500)

## 2019-08-10 MED ORDER — ATORVASTATIN CALCIUM 20 MG PO TABS
20.0000 mg | ORAL_TABLET | Freq: Every day | ORAL | 3 refills | Status: DC
Start: 2019-08-10 — End: 2020-06-26

## 2019-08-10 NOTE — Progress Notes (Signed)
Please let Rashaunda know her labs have returned.  Thyroid testing is normal, can continue current dosing of Levothyroxine.  Kidney function continues to show some mild kidney disease, but this is remaining stable and not worsening.  Cholesterol levels show LDL 94, would like to see this <70.  I would recommend trying to go up on Atorvastatin to 20 MG (that is your cholesterol medication).  If this is okay with you I will send this in and then would like you to schedule lab visit only for 6 weeks to recheck cholesterol.  Let me know.  You should be hearing from your referrals today or over next few days, listen out for your phone.  Have a great day!!

## 2019-08-10 NOTE — Telephone Encounter (Signed)
Patient has been notified that annual lung cancer screening low dose CT scan is due currently or will be in near future. Confirmed that patient is within the age range of 55-77, and asymptomatic, (no signs or symptoms of lung cancer). Patient denies illness that would prevent curative treatment for lung cancer if found. Verified smoking history, (current, 42 pack year). The shared decision making visit was done 05/11/18. Patient is agreeable for CT scan being scheduled.

## 2019-08-10 NOTE — Progress Notes (Signed)
Dose increase Atorvastatin to 20 MG

## 2019-08-11 ENCOUNTER — Telehealth (INDEPENDENT_AMBULATORY_CARE_PROVIDER_SITE_OTHER): Payer: Self-pay | Admitting: Gastroenterology

## 2019-08-11 DIAGNOSIS — K635 Polyp of colon: Secondary | ICD-10-CM

## 2019-08-11 LAB — UA/M W/RFLX CULTURE, ROUTINE
Bilirubin, UA: NEGATIVE
Glucose, UA: NEGATIVE
Ketones, UA: NEGATIVE
Nitrite, UA: NEGATIVE
Protein,UA: NEGATIVE
RBC, UA: NEGATIVE
Specific Gravity, UA: 1.015 (ref 1.005–1.030)
Urobilinogen, Ur: 0.2 mg/dL (ref 0.2–1.0)
pH, UA: 5 (ref 5.0–7.5)

## 2019-08-11 LAB — MICROSCOPIC EXAMINATION
Bacteria, UA: NONE SEEN
RBC, Urine: NONE SEEN /hpf (ref 0–2)

## 2019-08-11 LAB — URINE CULTURE, REFLEX

## 2019-08-11 MED ORDER — NA SULFATE-K SULFATE-MG SULF 17.5-3.13-1.6 GM/177ML PO SOLN: 1.0000 | Freq: Once | ORAL | 0 refills | Status: AC

## 2019-08-11 NOTE — Progress Notes (Signed)
Gastroenterology Pre-Procedure Review  Request Date: 08/24/19 Requesting Physician: Dr. Bonna Gains  PATIENT REVIEW QUESTIONS: The patient responded to the following health history questions as indicated:    1. Are you having any GI issues? no 2. Do you have a personal history of Polyps? yes (04/26/18 with Dr. Bonna Gains) 3. Do you have a family history of Colon Cancer or Polyps? no 4. Diabetes Mellitus? no 5. Joint replacements in the past 12 months?no 6. Major health problems in the past 3 months?no 7. Any artificial heart valves, MVP, or defibrillator?no    MEDICATIONS & ALLERGIES:    Patient reports the following regarding taking any anticoagulation/antiplatelet therapy:   Plavix, Coumadin, Eliquis, Xarelto, Lovenox, Pradaxa, Brilinta, or Effient? no Aspirin? no  Patient confirms/reports the following medications:  Current Outpatient Medications  Medication Sig Dispense Refill  . albuterol (VENTOLIN HFA) 108 (90 Base) MCG/ACT inhaler Inhale 1-2 puffs into the lungs every 6 (six) hours as needed for wheezing or shortness of breath. 18 g 3  . atorvastatin (LIPITOR) 20 MG tablet Take 1 tablet (20 mg total) by mouth daily. 90 tablet 3  . gabapentin (NEURONTIN) 300 MG capsule Take 1 capsule (300 mg total) by mouth at bedtime. 90 capsule 3  . levothyroxine (SYNTHROID) 175 MCG tablet Take 1 tablet (175 mcg total) by mouth daily before breakfast. 90 tablet 4  . lisinopril (ZESTRIL) 20 MG tablet Take 1 tablet (20 mg total) by mouth daily. 90 tablet 3  . meloxicam (MOBIC) 7.5 MG tablet TAKE 1 TABLET (7.5 MG TOTAL) BY MOUTH ONCE A DAY AS NEEDED ONLY 60 tablet 0  . oxybutynin (DITROPAN-XL) 10 MG 24 hr tablet Take 1 tablet (10 mg total) by mouth at bedtime. 90 tablet 3  . sertraline (ZOLOFT) 100 MG tablet TAKE 2 TABLETS BY MOUTH EVERY DAY 120 tablet 0  . SYMBICORT 160-4.5 MCG/ACT inhaler TAKE 2 PUFFS BY MOUTH TWICE A DAY 10.2 Inhaler 3   No current facility-administered medications for this  visit.    Patient confirms/reports the following allergies:  Allergies  Allergen Reactions  . Penicillins Other (See Comments)    Unknown- thinks its possibly a rash    No orders of the defined types were placed in this encounter.   AUTHORIZATION INFORMATION Primary Insurance: 1D#: Group #:  Secondary Insurance: 1D#: Group #:  SCHEDULE INFORMATION: Date: Wed 08/24/19 Time: Location:[facility]

## 2019-08-15 ENCOUNTER — Other Ambulatory Visit: Payer: Self-pay | Admitting: *Deleted

## 2019-08-15 ENCOUNTER — Ambulatory Visit: Admission: RE | Admit: 2019-08-15 | Payer: Medicaid Other | Source: Ambulatory Visit

## 2019-08-15 DIAGNOSIS — N3281 Overactive bladder: Secondary | ICD-10-CM

## 2019-08-16 ENCOUNTER — Encounter: Payer: Self-pay | Admitting: Urology

## 2019-08-16 ENCOUNTER — Other Ambulatory Visit: Payer: Self-pay

## 2019-08-16 ENCOUNTER — Ambulatory Visit (INDEPENDENT_AMBULATORY_CARE_PROVIDER_SITE_OTHER): Payer: Medicaid Other | Admitting: Urology

## 2019-08-16 ENCOUNTER — Other Ambulatory Visit
Admission: RE | Admit: 2019-08-16 | Discharge: 2019-08-16 | Disposition: A | Discharge status: Home / Self Care | Payer: Medicaid Other | Source: Ambulatory Visit | Attending: Urology | Admitting: Urology

## 2019-08-16 VITALS — BP 126/76 | HR 73 | Ht 69.0 in | Wt 229.0 lb

## 2019-08-16 DIAGNOSIS — N3281 Overactive bladder: Secondary | ICD-10-CM | POA: Diagnosis not present

## 2019-08-16 LAB — URINALYSIS, COMPLETE (UACMP) WITH MICROSCOPIC
Bacteria, UA: NONE SEEN
Bilirubin Urine: NEGATIVE
Glucose, UA: NEGATIVE mg/dL
Hgb urine dipstick: NEGATIVE
Ketones, ur: NEGATIVE mg/dL
Nitrite: NEGATIVE
Protein, ur: NEGATIVE mg/dL
Specific Gravity, Urine: 1.02 (ref 1.005–1.030)
pH: 5 (ref 5.0–8.0)

## 2019-08-16 LAB — BLADDER SCAN AMB NON-IMAGING: Scan Result: 12

## 2019-08-16 NOTE — Patient Instructions (Signed)
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
## 2019-08-22 ENCOUNTER — Other Ambulatory Visit: Admission: RE | Admit: 2019-08-22 | Payer: Medicaid Other | Source: Ambulatory Visit

## 2019-08-23 ENCOUNTER — Other Ambulatory Visit
Admission: RE | Admit: 2019-08-23 | Discharge: 2019-08-23 | Disposition: A | Discharge status: Home / Self Care | Payer: Medicaid Other | Source: Ambulatory Visit | Attending: Gastroenterology | Admitting: Gastroenterology

## 2019-08-23 ENCOUNTER — Other Ambulatory Visit: Payer: Self-pay

## 2019-08-23 DIAGNOSIS — Z20822 Contact with and (suspected) exposure to covid-19: Secondary | ICD-10-CM | POA: Diagnosis not present

## 2019-08-23 DIAGNOSIS — Z01812 Encounter for preprocedural laboratory examination: Secondary | ICD-10-CM | POA: Diagnosis not present

## 2019-08-23 LAB — SARS CORONAVIRUS 2 (TAT 6-24 HRS): SARS Coronavirus 2: NEGATIVE

## 2019-08-24 ENCOUNTER — Encounter
Admission: RE | Disposition: A | Discharge status: Home / Self Care | Payer: Self-pay | Source: Home / Self Care | Attending: Gastroenterology

## 2019-08-24 ENCOUNTER — Other Ambulatory Visit: Payer: Self-pay

## 2019-08-24 ENCOUNTER — Encounter: Payer: Self-pay | Admitting: Gastroenterology

## 2019-08-24 ENCOUNTER — Ambulatory Visit
Admission: RE | Admit: 2019-08-24 | Discharge: 2019-08-24 | Disposition: A | Discharge status: Home / Self Care | Payer: Medicaid Other | Attending: Gastroenterology | Admitting: Gastroenterology

## 2019-08-24 ENCOUNTER — Ambulatory Visit: Payer: Medicaid Other | Admitting: Certified Registered Nurse Anesthetist

## 2019-08-24 DIAGNOSIS — I10 Essential (primary) hypertension: Secondary | ICD-10-CM | POA: Diagnosis not present

## 2019-08-24 DIAGNOSIS — E782 Mixed hyperlipidemia: Secondary | ICD-10-CM | POA: Diagnosis not present

## 2019-08-24 DIAGNOSIS — E785 Hyperlipidemia, unspecified: Secondary | ICD-10-CM | POA: Insufficient documentation

## 2019-08-24 DIAGNOSIS — E039 Hypothyroidism, unspecified: Secondary | ICD-10-CM | POA: Insufficient documentation

## 2019-08-24 DIAGNOSIS — Z7951 Long term (current) use of inhaled steroids: Secondary | ICD-10-CM | POA: Diagnosis not present

## 2019-08-24 DIAGNOSIS — F17210 Nicotine dependence, cigarettes, uncomplicated: Secondary | ICD-10-CM | POA: Diagnosis not present

## 2019-08-24 DIAGNOSIS — Z79899 Other long term (current) drug therapy: Secondary | ICD-10-CM | POA: Insufficient documentation

## 2019-08-24 DIAGNOSIS — Z79890 Hormone replacement therapy (postmenopausal): Secondary | ICD-10-CM | POA: Diagnosis not present

## 2019-08-24 DIAGNOSIS — Z09 Encounter for follow-up examination after completed treatment for conditions other than malignant neoplasm: Secondary | ICD-10-CM | POA: Diagnosis present

## 2019-08-24 DIAGNOSIS — K648 Other hemorrhoids: Secondary | ICD-10-CM | POA: Diagnosis not present

## 2019-08-24 DIAGNOSIS — Z1211 Encounter for screening for malignant neoplasm of colon: Secondary | ICD-10-CM | POA: Diagnosis not present

## 2019-08-24 DIAGNOSIS — N183 Chronic kidney disease, stage 3 unspecified: Secondary | ICD-10-CM | POA: Diagnosis not present

## 2019-08-24 DIAGNOSIS — Z860100 Personal history of colon polyps, unspecified: Secondary | ICD-10-CM

## 2019-08-24 DIAGNOSIS — M1990 Unspecified osteoarthritis, unspecified site: Secondary | ICD-10-CM | POA: Diagnosis not present

## 2019-08-24 DIAGNOSIS — J449 Chronic obstructive pulmonary disease, unspecified: Secondary | ICD-10-CM | POA: Insufficient documentation

## 2019-08-24 DIAGNOSIS — K635 Polyp of colon: Secondary | ICD-10-CM

## 2019-08-24 DIAGNOSIS — D126 Benign neoplasm of colon, unspecified: Secondary | ICD-10-CM | POA: Insufficient documentation

## 2019-08-24 DIAGNOSIS — K644 Residual hemorrhoidal skin tags: Secondary | ICD-10-CM | POA: Insufficient documentation

## 2019-08-24 DIAGNOSIS — F329 Major depressive disorder, single episode, unspecified: Secondary | ICD-10-CM | POA: Insufficient documentation

## 2019-08-24 DIAGNOSIS — I129 Hypertensive chronic kidney disease with stage 1 through stage 4 chronic kidney disease, or unspecified chronic kidney disease: Secondary | ICD-10-CM | POA: Diagnosis not present

## 2019-08-24 HISTORY — PX: COLONOSCOPY WITH PROPOFOL: SHX5780

## 2019-08-24 SURGERY — COLONOSCOPY WITH PROPOFOL
Anesthesia: General

## 2019-08-24 MED ORDER — LIDOCAINE HCL (CARDIAC) PF 100 MG/5ML IV SOSY
PREFILLED_SYRINGE | INTRAVENOUS | Status: DC | PRN
  Administered 2019-08-24: 30 mg via INTRAVENOUS

## 2019-08-24 MED ORDER — PROPOFOL 500 MG/50ML IV EMUL
INTRAVENOUS | Status: DC | PRN
  Administered 2019-08-24: 100 ug/kg/min via INTRAVENOUS

## 2019-08-24 MED ORDER — SODIUM CHLORIDE 0.9 % IV SOLN: INTRAVENOUS | Status: DC

## 2019-08-24 MED ORDER — PROPOFOL 500 MG/50ML IV EMUL
INTRAVENOUS | Status: DC | PRN
  Administered 2019-08-24: 50 mg via INTRAVENOUS
  Administered 2019-08-24: 40 mg via INTRAVENOUS
  Administered 2019-08-24: 20 mg via INTRAVENOUS

## 2019-08-24 MED ORDER — PHENYLEPHRINE HCL (PRESSORS) 10 MG/ML IV SOLN
INTRAVENOUS | Status: DC | PRN
  Administered 2019-08-24: 100 ug via INTRAVENOUS

## 2019-08-24 NOTE — Anesthesia Preprocedure Evaluation (Signed)
Anesthesia Evaluation  Patient identified by MRN, date of birth, ID band Patient awake    Reviewed: Allergy & Precautions, NPO status , Patient's Chart, lab work & pertinent test results  History of Anesthesia Complications Negative for: history of anesthetic complications  Airway Mallampati: III  TM Distance: >3 FB Neck ROM: Full    Dental  (+) Poor Dentition   Pulmonary COPD,  COPD inhaler, Current SmokerPatient did not abstain from smoking.,    breath sounds clear to auscultation- rhonchi (-) wheezing      Cardiovascular hypertension, Pt. on medications (-) CAD, (-) Past MI, (-) Cardiac Stents and (-) CABG  Rhythm:Regular Rate:Normal - Systolic murmurs and - Diastolic murmurs    Neuro/Psych neg Seizures PSYCHIATRIC DISORDERS Depression negative neurological ROS     GI/Hepatic negative GI ROS, Neg liver ROS,   Endo/Other  neg diabetesHypothyroidism   Renal/GU negative Renal ROS     Musculoskeletal  (+) Arthritis ,   Abdominal (+) + obese,   Peds  Hematology negative hematology ROS (+)   Anesthesia Other Findings Past Medical History: No date: Arthritis No date: Depression No date: Hyperlipidemia No date: Hypertension No date: Thyroid disease   Reproductive/Obstetrics                             Anesthesia Physical Anesthesia Plan  ASA: III  Anesthesia Plan: General   Post-op Pain Management:    Induction: Intravenous  PONV Risk Score and Plan: 1 and Propofol infusion  Airway Management Planned: Natural Airway  Additional Equipment:   Intra-op Plan:   Post-operative Plan:   Informed Consent: I have reviewed the patients History and Physical, chart, labs and discussed the procedure including the risks, benefits and alternatives for the proposed anesthesia with the patient or authorized representative who has indicated his/her understanding and acceptance.     Dental  advisory given  Plan Discussed with: CRNA and Anesthesiologist  Anesthesia Plan Comments:         Anesthesia Quick Evaluation

## 2019-08-24 NOTE — Anesthesia Postprocedure Evaluation (Signed)
Anesthesia Post Note  Patient: Katrina Curtis  Procedure(s) Performed: COLONOSCOPY WITH PROPOFOL (N/A )  Patient location during evaluation: Endoscopy Anesthesia Type: General Level of consciousness: awake and alert and oriented Pain management: pain level controlled Vital Signs Assessment: post-procedure vital signs reviewed and stable Respiratory status: spontaneous breathing, nonlabored ventilation and respiratory function stable Cardiovascular status: blood pressure returned to baseline and stable Postop Assessment: no signs of nausea or vomiting Anesthetic complications: no     Last Vitals:  Vitals:   08/24/19 1201 08/24/19 1211  BP: 104/61 95/61  Pulse: 62   Resp: 11   Temp:    SpO2: 100%     Last Pain:  Vitals:   08/24/19 1221  TempSrc:   PainSc: 0-No pain                 Domino Holten
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
## 2019-08-24 NOTE — Op Note (Signed)
[facility] Patient Name: Laurieann Lyerly Procedure Date: 08/24/2019 11:00 AM MRN: [id number] Account #: [id number] Date of Birth: 1960-10-08 Admit Type: Outpatient Age: 59 Room: [facility] ENDO ROOM 2 Gender: Female Note Status: Finalized Procedure:             Colonoscopy Indications:           High risk colon cancer surveillance: Personal history                         of colonic polyps Providers:             Santiago Stenzel B. Bonna Gains MD, MD Referring MD:          Franchot Gallo (Referring MD) Medicines:             Monitored Anesthesia Care Complications:         No immediate complications. Procedure:             Pre-Anesthesia Assessment:                        - ASA Grade Assessment: II - A patient with mild                         systemic disease.                        - Prior to the procedure, a History and Physical was                         performed, and patient medications, allergies and                         sensitivities were reviewed. The patient's tolerance                         of previous anesthesia was reviewed.                        - The risks and benefits of the procedure and the                         sedation options and risks were discussed with the                         patient. All questions were answered and informed                         consent was obtained.                        - Patient identification and proposed procedure were                         verified prior to the procedure by the physician, the                         nurse, the anesthesiologist, the anesthetist and the                         technician. The procedure was verified  in the                         procedure room.                        After obtaining informed consent, the colonoscope was                         passed under direct vision. Throughout the procedure,                         the patient's blood pressure, pulse, and  oxygen                         saturations were monitored continuously. The                         Colonoscope was introduced through the anus and                         advanced to the the cecum, identified by appendiceal                         orifice and ileocecal valve. The colonoscopy was                         performed with ease. The patient tolerated the                         procedure well. The quality of the bowel preparation                         was poor. Findings:      The perianal exam findings include non-thrombosed external hemorrhoids.      A 5 mm polyp was found at 40 cm proximal to the anus. The polyp was       sessile. The polyp was removed with a cold biopsy forceps. Resection and       retrieval were complete.      The exam was otherwise without abnormality.      Non-bleeding internal hemorrhoids were found during retroflexion. Impression:            - Preparation of the colon was poor.                        - Non-thrombosed external hemorrhoids found on                         perianal exam.                        - One 5 mm polyp at 40 cm proximal to the anus,                         removed with a cold biopsy forceps. Resected and                         retrieved.                        -  The examination was otherwise normal.                        - Non-bleeding internal hemorrhoids. Recommendation:        - Discharge patient to home (with escort).                        - Advance diet as tolerated.                        - Continue present medications.                        - Await pathology results.                        - Repeat colonoscopy within 3 months, with 2 day prep.                        - The findings and recommendations were discussed with                         the patient.                        - The findings and recommendations were discussed with                         the patient's family.                        - Return to  primary care physician as previously                         scheduled.                        - High fiber diet. Procedure Code(s):     --- Professional ---                        [phone], Colonoscopy, flexible; with biopsy, single or                         multiple Diagnosis Code(s):     --- Professional ---                        Z86.010, Personal history of colonic polyps                        K64.4, Residual hemorrhoidal skin tags                        K64.8, Other hemorrhoids                        K63.5, Polyp of colon CPT copyright 2019 American Medical Association. All rights reserved. The codes documented in this report are preliminary and upon coder review may  be revised to meet current compliance requirements.  Vonda Antigua, MD Margretta Sidle B. Bonna Gains MD, MD 08/24/2019 11:59:12 AM This report has been signed electronically. Number of Addenda: 0 Note Initiated On: 08/24/2019  11:00 AM Scope Withdrawal Time: 0 hours 10 minutes 5 seconds  Total Procedure Duration: 0 hours 23 minutes 22 seconds  Estimated Blood Loss:  Estimated blood loss: none.      [facility]

## 2019-08-24 NOTE — Transfer of Care (Signed)
Immediate Anesthesia Transfer of Care Note  Patient: Katrina Curtis  Procedure(s) Performed: COLONOSCOPY WITH PROPOFOL (N/A )  Patient Location: PACU  Anesthesia Type:General  Level of Consciousness: awake  Airway & Oxygen Therapy: Patient Spontanous Breathing  Post-op Assessment: Report given to RN  Post vital signs: Reviewed and stable  Last Vitals:  Vitals Value Taken Time  BP 103/60 08/24/19 1153  Temp 36.4 C 08/24/19 1151  Pulse 70 08/24/19 1153  Resp 19 08/24/19 1153  SpO2 100 % 08/24/19 1153  Vitals shown include unvalidated device data.  Last Pain:  Vitals:   08/24/19 1151  TempSrc: Temporal  PainSc: Asleep         Complications: No apparent anesthesia complications

## 2019-08-25 ENCOUNTER — Encounter: Payer: Self-pay | Admitting: *Deleted

## 2019-08-25 LAB — SURGICAL PATHOLOGY

## 2019-08-26 ENCOUNTER — Other Ambulatory Visit: Payer: Self-pay

## 2019-08-26 ENCOUNTER — Telehealth: Payer: Self-pay

## 2019-08-26 DIAGNOSIS — Z86010 Personal history of colonic polyps: Secondary | ICD-10-CM

## 2019-08-26 NOTE — Telephone Encounter (Signed)
-----   Message from Virgel Manifold, MD sent at 08/26/2019 10:18 AM EDT ----- Herb Grays please let the patient know, her colon polyp was precancerous and was removed.  Due to the poor prep on her colonoscopy I would recommend a repeat colonoscopy within 3 to 6 months with a 2-day prep.  Indication is history of polyps.

## 2019-08-26 NOTE — Telephone Encounter (Signed)
Called patient to let her know that Dr. Bonna Gains reviewed her pathology report and it showed that her polyp was precancerous and that sh had removed it. Patient was also told that due to poor prepping for her colonoscopy and the history of her having polyps, Dr. Bonna Gains was recommending a repeat colonoscopy in 3-6 months. Patient agreed but would like to do it in 6 months. Therefore, she agreed on having the colonoscopy on 02/13/2020. Patient was also informed that I would mail her a 2 day prep per Dr. Michele Mcalpine request. Patient agreed and had no further questions.

## 2019-09-13 ENCOUNTER — Telehealth: Payer: Self-pay | Admitting: *Deleted

## 2019-09-13 NOTE — Telephone Encounter (Signed)
(  09/13/19) Pt has been notified that lung cancer screening CT scan is due currently or will be in near future. Confirmed pt is within appropriate age range, and asymptomatic. Pt denies illness that would prevent curative treatment for lung cancer if found. Verified smoking history (Current Smoker,1 ppd ). Pt is agreeable for CT scan being scheduled, and prefers a morning appt (10am-11am) due to transportation issues. SRW

## 2019-09-20 ENCOUNTER — Other Ambulatory Visit: Payer: Self-pay | Admitting: Nurse Practitioner

## 2019-09-20 ENCOUNTER — Other Ambulatory Visit: Payer: Self-pay

## 2019-09-20 NOTE — Telephone Encounter (Signed)
Routing to provider  

## 2019-09-20 NOTE — Telephone Encounter (Signed)
Requested medication (s) are due for refill today: no  Requested medication (s) are on the active medication list: yes  Last refill:  07/28/2019  Future visit scheduled: no  Notes to clinic:   : REQUEST FOR 90 DAYS PRESCRIPTION  Requested Prescriptions  Pending Prescriptions Disp Refills   sertraline (ZOLOFT) 100 MG tablet [Pharmacy Med Name: SERTRALINE HCL 100 MG TABLET] 180 tablet 1    Sig: TAKE 2 TABLETS BY MOUTH EVERY DAY      Psychiatry:  Antidepressants - SSRI Passed - 09/20/2019  8:31 AM      Passed - Completed PHQ-2 or PHQ-9 in the last 360 days.      Passed - Valid encounter within last 6 months    Recent Outpatient Visits           1 month ago Centrilobular emphysema ([place])   [place], Choudrant T, NP   9 months ago HTN (hypertension), benign   [place], Rote T, NP   10 months ago Depression, recurrent [facility])   [place] Marnee Guarneri T, NP   11 months ago Exposure to [place], Lilia Argue, [place]   1 year ago Simple chronic bronchitis [employer])   [facility] Volney American, [place]       Future Appointments             In 1 week Diamantina [place], Herbert Seta, [place]

## 2019-09-23 ENCOUNTER — Ambulatory Visit: Payer: Medicaid Other | Attending: Oncology

## 2019-09-26 ENCOUNTER — Other Ambulatory Visit: Payer: Self-pay | Admitting: *Deleted

## 2019-09-26 DIAGNOSIS — N3281 Overactive bladder: Secondary | ICD-10-CM

## 2019-09-27 ENCOUNTER — Ambulatory Visit: Payer: Medicaid Other | Admitting: Urology

## 2019-09-27 ENCOUNTER — Other Ambulatory Visit: Payer: Self-pay

## 2019-09-29 ENCOUNTER — Other Ambulatory Visit: Payer: Self-pay | Admitting: *Deleted

## 2019-09-29 DIAGNOSIS — N3281 Overactive bladder: Secondary | ICD-10-CM

## 2019-10-04 ENCOUNTER — Other Ambulatory Visit: Payer: Self-pay

## 2019-10-04 ENCOUNTER — Ambulatory Visit: Payer: Medicaid Other | Admitting: Urology

## 2019-10-04 ENCOUNTER — Other Ambulatory Visit: Payer: Medicaid Other

## 2019-10-04 DIAGNOSIS — E782 Mixed hyperlipidemia: Secondary | ICD-10-CM

## 2019-10-05 ENCOUNTER — Encounter: Payer: Self-pay | Admitting: Nurse Practitioner

## 2019-10-05 ENCOUNTER — Ambulatory Visit (INDEPENDENT_AMBULATORY_CARE_PROVIDER_SITE_OTHER): Payer: Medicaid Other | Admitting: Nurse Practitioner

## 2019-10-05 DIAGNOSIS — M5441 Lumbago with sciatica, right side: Secondary | ICD-10-CM

## 2019-10-05 DIAGNOSIS — G8929 Other chronic pain: Secondary | ICD-10-CM | POA: Diagnosis not present

## 2019-10-05 LAB — LIPID PANEL W/O CHOL/HDL RATIO
Cholesterol, Total: 156 mg/dL (ref 100–199)
HDL: 46 mg/dL (ref >39)
LDL Chol Calc (NIH): 90 mg/dL (ref 0–99)
Triglycerides: 108 mg/dL (ref 0–149)
VLDL Cholesterol Cal: 20 mg/dL (ref 5–40)

## 2019-10-05 MED ORDER — MELOXICAM 7.5 MG PO TABS: ORAL_TABLET | ORAL | 0 refills | Status: DC

## 2019-10-05 MED ORDER — PREDNISONE 10 MG PO TABS: ORAL_TABLET | ORAL | 0 refills | Status: DC

## 2019-10-05 MED ORDER — CYCLOBENZAPRINE HCL 10 MG PO TABS
10.0000 mg | ORAL_TABLET | Freq: Three times a day (TID) | ORAL | 0 refills | Status: DC | PRN
Start: 2019-10-05 — End: 2020-06-26

## 2019-10-05 NOTE — Progress Notes (Signed)
Please let Katrina Curtis know her cholesterol labs are slightly improved, but LDL still above goal of <70 -- her level is 90.  We will continue Atorvastatin 20 MG at this time and plan to recheck next visit in office, may need to increase Atorvastatin more in future.  Have a great day!!

## 2019-10-05 NOTE — Patient Instructions (Signed)

## 2019-10-05 NOTE — Assessment & Plan Note (Signed)
With current acute flare with right sided sciatic pain.  Will send in Prednisone taper, Flexeril, and refill on Meloxicam (to use sparsely only).  Recommend she take Gabapentin every night and Tylenol as needed (max 3000 MG daily total).  Recommend use of heat and ice at home plus OTC Voltaren gel.  Discussed with her possibility of referral to pain management or neurosurgery in future due to ongoing acute flares with chronic back issues present on MRI, she wishes to think about these.  Does not wish to pursue PT at this time, but may benefit from this.  Return as scheduled or for worsening/ongoing symptoms.
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
## 2019-10-31 ENCOUNTER — Ambulatory Visit: Admission: RE | Admit: 2019-10-31 | Payer: Medicaid Other | Source: Ambulatory Visit

## 2019-10-31 ENCOUNTER — Other Ambulatory Visit: Payer: Self-pay | Admitting: Nurse Practitioner

## 2019-10-31 NOTE — Telephone Encounter (Signed)
Requested Prescriptions  Pending Prescriptions Disp Refills  . lisinopril (ZESTRIL) 20 MG tablet [Pharmacy Med Name: LISINOPRIL 20 MG TABLET] 90 tablet 1    Sig: TAKE 1 TABLET BY MOUTH EVERY DAY     Cardiovascular:  ACE Inhibitors Failed - 10/31/2019  3:55 PM      Failed - Cr in normal range and within 180 days    Creatinine, Ser  Date Value Ref Range Status  08/09/2019 1.14 (H) 0.57 - 1.00 mg/dL Final         Passed - K in normal range and within 180 days    Potassium  Date Value Ref Range Status  08/09/2019 4.5 3.5 - 5.2 mmol/L Final         Passed - Patient is not pregnant      Passed - Last BP in normal range    BP Readings from Last 1 Encounters:  10/05/19 118/77         Passed - Valid encounter within last 6 months    Recent Outpatient Visits          3 weeks ago Chronic bilateral low back pain with right-sided sciatica   [facility] [place], Henrine Screws T, NP   2 months ago Centrilobular emphysema ([place])   [place], [place] T, NP   11 months ago HTN (hypertension), benign   [place], [place] T, NP   12 months ago Depression, recurrent ([place])   [place], Henrine Screws T, NP   1 year ago Exposure to [place], [place], [place]

## 2019-11-07 ENCOUNTER — Telehealth: Payer: Self-pay

## 2019-11-07 NOTE — Telephone Encounter (Signed)
Patient notified that lung screening imaging is due currently or in the near future. Patient's preference is for 11/14/19 @ 10:00.  Patient is a current smoker, smoking 1 pack per day the past year.

## 2019-11-08 ENCOUNTER — Other Ambulatory Visit: Payer: Self-pay | Admitting: *Deleted

## 2019-11-14 ENCOUNTER — Ambulatory Visit: Payer: Medicaid Other | Attending: Oncology

## 2019-11-27 ENCOUNTER — Other Ambulatory Visit: Payer: Self-pay | Admitting: Nurse Practitioner

## 2019-11-30 ENCOUNTER — Other Ambulatory Visit: Payer: Self-pay | Admitting: Nurse Practitioner

## 2019-12-28 ENCOUNTER — Other Ambulatory Visit: Payer: Self-pay

## 2019-12-28 ENCOUNTER — Ambulatory Visit
Admission: RE | Admit: 2019-12-28 | Discharge: 2019-12-28 | Disposition: A | Discharge status: Home / Self Care | Payer: Medicaid Other | Source: Ambulatory Visit | Attending: Oncology | Admitting: Oncology

## 2019-12-28 DIAGNOSIS — Z122 Encounter for screening for malignant neoplasm of respiratory organs: Secondary | ICD-10-CM | POA: Diagnosis present

## 2019-12-28 DIAGNOSIS — Z87891 Personal history of nicotine dependence: Secondary | ICD-10-CM | POA: Insufficient documentation

## 2019-12-30 ENCOUNTER — Encounter: Payer: Self-pay | Admitting: *Deleted

## 2019-12-30 ENCOUNTER — Telehealth: Payer: Self-pay

## 2019-12-30 NOTE — Telephone Encounter (Signed)
Called patient but she did not answer and her voicemail is full. I will call her again next week to let her know that I scheduled her at the wrong location for her colonoscopy since Dr. Bonna Gains will be in [place] scoping in the afternoon instead of [place].

## 2020-01-02 ENCOUNTER — Other Ambulatory Visit: Payer: Self-pay

## 2020-01-02 DIAGNOSIS — Z86010 Personal history of colonic polyps: Secondary | ICD-10-CM

## 2020-01-02 MED ORDER — NA SULFATE-K SULFATE-MG SULF 17.5-3.13-1.6 GM/177ML PO SOLN: ORAL | 0 refills | Status: DC

## 2020-01-02 NOTE — Telephone Encounter (Signed)
Patient was contacted and I explained that she will have her procedure done at [facility] instead of [facility]. Patient understood. Prescription for her procedure was sent to her pharmacy as well.

## 2020-02-07 ENCOUNTER — Other Ambulatory Visit: Payer: Self-pay

## 2020-02-07 ENCOUNTER — Encounter: Payer: Self-pay | Admitting: Gastroenterology

## 2020-02-13 ENCOUNTER — Ambulatory Visit: Admit: 2020-02-13 | Payer: Medicaid Other | Admitting: Gastroenterology

## 2020-02-13 SURGERY — COLONOSCOPY WITH PROPOFOL
Anesthesia: General

## 2020-02-14 ENCOUNTER — Other Ambulatory Visit: Payer: Self-pay | Admitting: Nurse Practitioner

## 2020-02-14 NOTE — Telephone Encounter (Signed)
Requested medication (s) are due for refill today: yes  Requested medication (s) are on the active medication list: yes  Last refill:  11/27/19  Future visit scheduled: no  Notes to clinic:  08/09/19 Creatinine 1.14- please review if appropriate to refill   Requested Prescriptions  Pending Prescriptions Disp Refills   meloxicam (MOBIC) 7.5 MG tablet [Pharmacy Med Name: MELOXICAM 7.5 MG TABLET] 60 tablet 0    Sig: TAKE 1 TABLET (7.5 MG TOTAL) BY MOUTH ONCE A DAY AS NEEDED ONLY      Analgesics:  COX2 Inhibitors Failed - 02/14/2020 11:53 AM      Failed - HGB in normal range and within 360 days    Hemoglobin  Date Value Ref Range Status  03/30/2018 11.6 11.1 - 15.9 g/dL Final          Failed - Cr in normal range and within 360 days    Creatinine, Ser  Date Value Ref Range Status  08/09/2019 1.14 (H) 0.57 - 1.00 mg/dL Final          Passed - Patient is not pregnant      Passed - Valid encounter within last 12 months    Recent Outpatient Visits           4 months ago Chronic bilateral low back pain with right-sided sciatica   [place], Henrine Screws T, NP   6 months ago Centrilobular emphysema ([place])   [place], Henrine Screws T, NP   1 year ago HTN (hypertension), benign   [place], [place] T, NP   1 year ago Depression, recurrent ([place])   [place], Jolene T, NP   1 year ago Exposure to [place], [place], [place]

## 2020-02-14 NOTE — Telephone Encounter (Signed)
Patient last seen 10/05/19

## 2020-03-27 ENCOUNTER — Other Ambulatory Visit: Payer: Self-pay

## 2020-03-27 ENCOUNTER — Encounter: Payer: Self-pay | Admitting: Gastroenterology

## 2020-03-28 ENCOUNTER — Encounter: Payer: Self-pay | Admitting: Anesthesiology

## 2020-04-05 ENCOUNTER — Other Ambulatory Visit: Admission: RE | Admit: 2020-04-05 | Payer: Medicaid Other | Source: Ambulatory Visit

## 2020-04-20 ENCOUNTER — Other Ambulatory Visit: Admission: RE | Admit: 2020-04-20 | Payer: Medicaid Other | Source: Ambulatory Visit

## 2020-04-24 ENCOUNTER — Ambulatory Visit: Admission: RE | Admit: 2020-04-24 | Payer: Medicaid Other | Source: Home / Self Care | Admitting: Gastroenterology

## 2020-04-24 HISTORY — DX: Chronic obstructive pulmonary disease, unspecified: J44.9

## 2020-04-24 SURGERY — COLONOSCOPY WITH PROPOFOL
Anesthesia: Choice

## 2020-05-24 ENCOUNTER — Other Ambulatory Visit: Payer: Self-pay | Admitting: Nurse Practitioner

## 2020-06-04 ENCOUNTER — Ambulatory Visit: Payer: Medicaid Other | Admitting: Nurse Practitioner

## 2020-06-04 ENCOUNTER — Encounter: Payer: Self-pay | Admitting: Nurse Practitioner

## 2020-06-04 ENCOUNTER — Other Ambulatory Visit: Payer: Self-pay

## 2020-06-05 ENCOUNTER — Other Ambulatory Visit: Payer: Self-pay | Admitting: Nurse Practitioner

## 2020-06-05 NOTE — Telephone Encounter (Signed)
  Notes to clinic: Patient has appointment on 06/11/2020 Review for short supply until then    Requested Prescriptions  Pending Prescriptions Disp Refills   lisinopril (ZESTRIL) 20 MG tablet [Pharmacy Med Name: LISINOPRIL 20 MG TABLET] 90 tablet 1    Sig: TAKE 1 TABLET BY MOUTH EVERY DAY      Cardiovascular:  ACE Inhibitors Failed - 06/05/2020 10:46 AM      Failed - Cr in normal range and within 180 days    Creatinine, Ser  Date Value Ref Range Status  08/09/2019 1.14 (H) 0.57 - 1.00 mg/dL Final          Failed - K in normal range and within 180 days    Potassium  Date Value Ref Range Status  08/09/2019 4.5 3.5 - 5.2 mmol/L Final          Failed - Valid encounter within last 6 months    Recent Outpatient Visits           8 months ago Chronic bilateral low back pain with right-sided sciatica   [facility] [place], Barbaraann Faster, NP   10 months ago Centrilobular emphysema ([place])   [place], [place] T, NP   1 year ago HTN (hypertension), benign   [place], [place] T, NP   1 year ago Depression, recurrent ([place])   [place], Henrine Screws T, NP   1 year ago Exposure to [place], Lilia Argue, PA-C       Future Appointments             In 6 days Venita Lick, NP [employer], [place] - Patient is not pregnant      Passed - Last BP in normal range    BP Readings from Last 1 Encounters:  10/05/19 118/77

## 2020-06-05 NOTE — Telephone Encounter (Signed)
Pt states a 30 day was to be sent in for her after she was late to appt and has rescheduled.  But ii is not at the pharmacy. Please advise.

## 2020-06-05 NOTE — Telephone Encounter (Signed)
Does patient need an appointment? Please advise. Patient last seen in July of 2021 and no upcoming appointment.

## 2020-06-11 ENCOUNTER — Ambulatory Visit: Payer: Medicaid Other | Admitting: Nurse Practitioner

## 2020-06-18 ENCOUNTER — Ambulatory Visit: Payer: Medicaid Other | Admitting: Nurse Practitioner

## 2020-06-26 ENCOUNTER — Ambulatory Visit: Payer: Medicaid Other | Admitting: Nurse Practitioner

## 2020-06-26 ENCOUNTER — Other Ambulatory Visit: Payer: Self-pay

## 2020-06-26 ENCOUNTER — Encounter: Payer: Self-pay | Admitting: Nurse Practitioner

## 2020-06-26 VITALS — BP 114/78 | HR 71 | Temp 98.3°F | Wt 224.4 lb

## 2020-06-26 DIAGNOSIS — Z6833 Body mass index (BMI) 33.0-33.9, adult: Secondary | ICD-10-CM

## 2020-06-26 DIAGNOSIS — E559 Vitamin D deficiency, unspecified: Secondary | ICD-10-CM

## 2020-06-26 DIAGNOSIS — I2510 Atherosclerotic heart disease of native coronary artery without angina pectoris: Secondary | ICD-10-CM | POA: Diagnosis not present

## 2020-06-26 DIAGNOSIS — M5441 Lumbago with sciatica, right side: Secondary | ICD-10-CM | POA: Diagnosis not present

## 2020-06-26 DIAGNOSIS — I77810 Thoracic aortic ectasia: Secondary | ICD-10-CM | POA: Diagnosis not present

## 2020-06-26 DIAGNOSIS — N3281 Overactive bladder: Secondary | ICD-10-CM

## 2020-06-26 DIAGNOSIS — J432 Centrilobular emphysema: Secondary | ICD-10-CM | POA: Diagnosis not present

## 2020-06-26 DIAGNOSIS — I10 Essential (primary) hypertension: Secondary | ICD-10-CM | POA: Diagnosis not present

## 2020-06-26 DIAGNOSIS — E039 Hypothyroidism, unspecified: Secondary | ICD-10-CM | POA: Diagnosis not present

## 2020-06-26 DIAGNOSIS — F339 Major depressive disorder, recurrent, unspecified: Secondary | ICD-10-CM | POA: Diagnosis not present

## 2020-06-26 DIAGNOSIS — E6609 Other obesity due to excess calories: Secondary | ICD-10-CM

## 2020-06-26 DIAGNOSIS — E782 Mixed hyperlipidemia: Secondary | ICD-10-CM | POA: Diagnosis not present

## 2020-06-26 DIAGNOSIS — F17210 Nicotine dependence, cigarettes, uncomplicated: Secondary | ICD-10-CM

## 2020-06-26 DIAGNOSIS — N1831 Chronic kidney disease, stage 3a: Secondary | ICD-10-CM | POA: Diagnosis not present

## 2020-06-26 DIAGNOSIS — G8929 Other chronic pain: Secondary | ICD-10-CM

## 2020-06-26 DIAGNOSIS — I700 Atherosclerosis of aorta: Secondary | ICD-10-CM | POA: Diagnosis not present

## 2020-06-26 LAB — URINALYSIS, ROUTINE W REFLEX MICROSCOPIC
Bilirubin, UA: NEGATIVE
Glucose, UA: NEGATIVE
Ketones, UA: NEGATIVE
Leukocytes,UA: NEGATIVE
Nitrite, UA: NEGATIVE
Protein,UA: NEGATIVE
RBC, UA: NEGATIVE
Specific Gravity, UA: 1.02 (ref 1.005–1.030)
Urobilinogen, Ur: 0.2 mg/dL (ref 0.2–1.0)
pH, UA: 5.5 (ref 5.0–7.5)

## 2020-06-26 MED ORDER — LISINOPRIL 20 MG PO TABS
20.0000 mg | ORAL_TABLET | Freq: Every day | ORAL | 4 refills | Status: DC
Start: 2020-06-26 — End: 2021-09-02

## 2020-06-26 MED ORDER — OXYBUTYNIN CHLORIDE ER 15 MG PO TB24: 15.0000 mg | ORAL_TABLET | Freq: Every day | ORAL | 4 refills | Status: DC

## 2020-06-26 MED ORDER — SERTRALINE HCL 100 MG PO TABS
200.0000 mg | ORAL_TABLET | Freq: Every day | ORAL | 4 refills | Status: DC
Start: 2020-06-26 — End: 2021-10-03

## 2020-06-26 MED ORDER — ATORVASTATIN CALCIUM 20 MG PO TABS: 20.0000 mg | ORAL_TABLET | Freq: Every day | ORAL | 4 refills | Status: DC

## 2020-06-26 MED ORDER — GABAPENTIN 300 MG PO CAPS: ORAL_CAPSULE | ORAL | 4 refills | Status: DC

## 2020-06-26 MED ORDER — MELOXICAM 7.5 MG PO TABS: ORAL_TABLET | ORAL | 3 refills | Status: DC

## 2020-06-26 MED ORDER — BUDESONIDE-FORMOTEROL FUMARATE 160-4.5 MCG/ACT IN AERO: INHALATION_SPRAY | RESPIRATORY_TRACT | 6 refills | Status: DC

## 2020-06-26 MED ORDER — LEVOTHYROXINE SODIUM 175 MCG PO TABS: 175.0000 ug | ORAL_TABLET | Freq: Every day | ORAL | 4 refills | Status: DC

## 2020-06-26 NOTE — Assessment & Plan Note (Signed)
Chronic, stable.  BP below goal on exam.  Will continue Lisinopril 20 MG daily at this time for kidney protection, consider reduction if lower readings and symptomatic -- could also consider change to Losartan as is a smoker.  Recommend complete cessation smoking.  Recommend she monitor BP at least a few mornings a week at home and document.  DASH diet at home.  Labs today: CMP, TSH. UA.  Return in 6 months.

## 2020-06-26 NOTE — Assessment & Plan Note (Signed)
Chronic, stable.  Denies SI/HI.  Continue current medication regimen and adjust as needed.  Monitor NA+ level, CMP today. 

## 2020-06-26 NOTE — Patient Instructions (Signed)

## 2020-06-26 NOTE — Assessment & Plan Note (Signed)
Ognoing.  Recommend she take Gabapentin every night and Tylenol as needed (max 3000 MG daily total).  Recommend use of heat and ice at home plus OTC Voltaren gel.  Discussed with her and referral to ortho placed due to chronic back issues present on MRI.  Does not wish to pursue PT at this time, but may benefit from this.  Return as scheduled or for worsening/ongoing symptoms.

## 2020-06-26 NOTE — Assessment & Plan Note (Signed)
Chronic, ongoing.  Continue current medication regimen and adjust as needed.  Lipid panel today.  Recommend complete cessation of smoking for prevention. 
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
## 2020-06-26 NOTE — Assessment & Plan Note (Signed)
Chronic, ongoing with some worsening.  Will trial increase in Oxybutynin to 15 MG XL tablet daily, script sent.  UA today.  Recommend complete cessation of smoking and educated on diet changes to assist with symptoms.  Referral to urology placed for further assessment and recommendations due to ongoing symptoms with some worsening.  Return in 6 months for follow-up.

## 2020-06-26 NOTE — Assessment & Plan Note (Signed)
Chronic, ongoing.  Continue current medication regimen and adjust as needed.  Thyroid labs today. 

## 2020-06-26 NOTE — Assessment & Plan Note (Signed)
Chronic, stable.  Continue Lisinopril for kidney protection.  CMP today.  If worsening or decline in function consider referral to nephrology.

## 2020-06-26 NOTE — Assessment & Plan Note (Signed)
BMI 33.14.  Recommended eating smaller high protein, low fat meals more frequently and exercising 30 mins a day 5 times a week with a goal of 10-15lb weight loss in the next 3 months. Patient voiced their understanding and motivation to adhere to these recommendations.

## 2020-06-26 NOTE — Assessment & Plan Note (Signed)
Noted on past CT lung imaging.  Recommend continued statin use daily + recommend complete cessation of smoking.  Discussed benefit of adding on daily Baby ASA for prevention, recommend adding this on.

## 2020-06-26 NOTE — Assessment & Plan Note (Signed)
Noted on lung CT screening, recent imaging September 2021 remained stable with recommendation to repeat in 12 months.

## 2020-06-26 NOTE — Assessment & Plan Note (Signed)
I have recommended complete cessation of tobacco use. I have discussed various options available for assistance with tobacco cessation including over the counter methods (Nicotine gum, patch and lozenges). We also discussed prescription options (Chantix, Nicotine Inhaler / Nasal Spray). The patient is not interested in pursuing any prescription tobacco cessation options at this time.  

## 2020-06-26 NOTE — Assessment & Plan Note (Signed)
Recommend continuing Atorvastatin and taking a low dose, ASA chewable 81 MG daily.

## 2020-06-26 NOTE — Assessment & Plan Note (Signed)
Chronic, ongoing.  No recent exacerbations.  Continue current medication regimen and adjust as needed, refills on Albuterol sent. Spirometry next visit for yearly exam.  Recommend she schedule her yearly CT lung screening + recommend complete cessation of smoking.

## 2020-06-27 ENCOUNTER — Other Ambulatory Visit: Payer: Self-pay | Admitting: Nurse Practitioner

## 2020-06-27 LAB — CBC WITH DIFFERENTIAL/PLATELET
Basophils Absolute: 0.1 10*3/uL (ref 0.0–0.2)
Basos: 1 %
EOS (ABSOLUTE): 0.1 10*3/uL (ref 0.0–0.4)
Eos: 1 %
Hematocrit: 38.6 % (ref 34.0–46.6)
Hemoglobin: 13.1 g/dL (ref 11.1–15.9)
Immature Grans (Abs): 0 10*3/uL (ref 0.0–0.1)
Immature Granulocytes: 1 %
Lymphocytes Absolute: 2.5 10*3/uL (ref 0.7–3.1)
Lymphs: 28 %
MCH: 30.7 pg (ref 26.6–33.0)
MCHC: 33.9 g/dL (ref 31.5–35.7)
MCV: 90 fL (ref 79–97)
Monocytes Absolute: 0.5 10*3/uL (ref 0.1–0.9)
Monocytes: 5 %
Neutrophils Absolute: 5.5 10*3/uL (ref 1.4–7.0)
Neutrophils: 64 %
Platelets: 176 10*3/uL (ref 150–450)
RBC: 4.27 x10E6/uL (ref 3.77–5.28)
RDW: 13 % (ref 11.7–15.4)
WBC: 8.7 10*3/uL (ref 3.4–10.8)

## 2020-06-27 LAB — LIPID PANEL W/O CHOL/HDL RATIO
Cholesterol, Total: 178 mg/dL (ref 100–199)
HDL: 53 mg/dL (ref >39)
LDL Chol Calc (NIH): 104 mg/dL — ABNORMAL HIGH (ref 0–99)
Triglycerides: 120 mg/dL (ref 0–149)
VLDL Cholesterol Cal: 21 mg/dL (ref 5–40)

## 2020-06-27 LAB — TSH: TSH: 1.77 u[IU]/mL (ref 0.450–4.500)

## 2020-06-27 LAB — COMPREHENSIVE METABOLIC PANEL
ALT: 15 IU/L (ref 0–32)
AST: 16 IU/L (ref 0–40)
Albumin/Globulin Ratio: 1.5 (ref 1.2–2.2)
Albumin: 4.6 g/dL (ref 3.8–4.9)
Alkaline Phosphatase: 86 IU/L (ref 44–121)
BUN/Creatinine Ratio: 24 — ABNORMAL HIGH (ref 9–23)
BUN: 25 mg/dL — ABNORMAL HIGH (ref 6–24)
Bilirubin Total: 0.4 mg/dL (ref 0.0–1.2)
CO2: 19 mmol/L — ABNORMAL LOW (ref 20–29)
Calcium: 9.7 mg/dL (ref 8.7–10.2)
Chloride: 104 mmol/L (ref 96–106)
Creatinine, Ser: 1.06 mg/dL — ABNORMAL HIGH (ref 0.57–1.00)
Globulin, Total: 3.1 g/dL (ref 1.5–4.5)
Glucose: 97 mg/dL (ref 65–99)
Potassium: 4.8 mmol/L (ref 3.5–5.2)
Sodium: 140 mmol/L (ref 134–144)
Total Protein: 7.7 g/dL (ref 6.0–8.5)
eGFR: 61 mL/min/{1.73_m2} (ref >59)

## 2020-06-27 LAB — T4, FREE: Free T4: 1.39 ng/dL (ref 0.82–1.77)

## 2020-06-27 LAB — VITAMIN D 25 HYDROXY (VIT D DEFICIENCY, FRACTURES): Vit D, 25-Hydroxy: 36.6 ng/mL (ref 30.0–100.0)

## 2020-06-27 MED ORDER — ATORVASTATIN CALCIUM 40 MG PO TABS: 40.0000 mg | ORAL_TABLET | Freq: Every day | ORAL | 4 refills | Status: DC

## 2020-06-27 NOTE — Progress Notes (Signed)
Good morning please let Sheba know her labs have returned and overall continue to be stable.  Her creatinine, one of her kidney labs, was mildly elevated.  Would recommend increased fluid intake at home.  Thyroid labs normal, can continue current Levothyroxine dosing.  Vitamin D level is normal, continue supplement.  Urine was negative for infection.  CBC showed no anemia.  LDL was mildly elevated, I would like to increase Atorvastatin to 40 MG, if she has 20 MG tablets left she can start taking 2 of them daily and I will send in new 40 MG script, then recheck next visit.  Any questions? Keep being awesome!!  Thank you for allowing me to participate in your care. Kindest regards, Catilyn Boggus

## 2020-07-02 ENCOUNTER — Other Ambulatory Visit: Payer: Self-pay | Admitting: *Deleted

## 2020-07-02 DIAGNOSIS — N3281 Overactive bladder: Secondary | ICD-10-CM

## 2020-07-03 ENCOUNTER — Ambulatory Visit: Payer: Medicaid Other | Admitting: Urology

## 2020-08-17 ENCOUNTER — Other Ambulatory Visit: Payer: Self-pay

## 2020-08-17 DIAGNOSIS — Z1231 Encounter for screening mammogram for malignant neoplasm of breast: Secondary | ICD-10-CM

## 2020-08-22 LAB — HM DIABETES EYE EXAM

## 2020-08-29 ENCOUNTER — Other Ambulatory Visit: Payer: Self-pay | Admitting: Nurse Practitioner

## 2020-10-17 DIAGNOSIS — N3946 Mixed incontinence: Secondary | ICD-10-CM | POA: Diagnosis not present

## 2020-10-17 DIAGNOSIS — N3281 Overactive bladder: Secondary | ICD-10-CM | POA: Diagnosis not present

## 2020-11-28 ENCOUNTER — Other Ambulatory Visit: Payer: Self-pay | Admitting: Nurse Practitioner

## 2020-12-04 DIAGNOSIS — N3281 Overactive bladder: Secondary | ICD-10-CM | POA: Diagnosis not present

## 2020-12-04 DIAGNOSIS — N3946 Mixed incontinence: Secondary | ICD-10-CM | POA: Diagnosis not present

## 2020-12-27 ENCOUNTER — Ambulatory Visit: Payer: Medicaid Other | Admitting: Nurse Practitioner

## 2021-01-21 DIAGNOSIS — N3946 Mixed incontinence: Secondary | ICD-10-CM | POA: Diagnosis not present

## 2021-01-21 DIAGNOSIS — N3281 Overactive bladder: Secondary | ICD-10-CM | POA: Diagnosis not present

## 2021-03-07 DIAGNOSIS — N3946 Mixed incontinence: Secondary | ICD-10-CM | POA: Diagnosis not present

## 2021-03-07 DIAGNOSIS — N3281 Overactive bladder: Secondary | ICD-10-CM | POA: Diagnosis not present

## 2021-04-29 DIAGNOSIS — N3281 Overactive bladder: Secondary | ICD-10-CM | POA: Diagnosis not present

## 2021-04-29 DIAGNOSIS — N3946 Mixed incontinence: Secondary | ICD-10-CM | POA: Diagnosis not present

## 2021-05-09 ENCOUNTER — Telehealth: Payer: Self-pay | Admitting: Acute Care

## 2021-05-09 NOTE — Telephone Encounter (Signed)
Attempted to contact pt to schedule f/u lung screening CT. Voicemail was full and was unable to leave a message. Will call back.

## 2021-06-20 DIAGNOSIS — N3281 Overactive bladder: Secondary | ICD-10-CM | POA: Diagnosis not present

## 2021-06-20 DIAGNOSIS — N3946 Mixed incontinence: Secondary | ICD-10-CM | POA: Diagnosis not present

## 2021-08-02 DIAGNOSIS — N3281 Overactive bladder: Secondary | ICD-10-CM | POA: Diagnosis not present

## 2021-08-02 DIAGNOSIS — N3946 Mixed incontinence: Secondary | ICD-10-CM | POA: Diagnosis not present

## 2021-08-29 ENCOUNTER — Other Ambulatory Visit: Payer: Self-pay | Admitting: Nurse Practitioner

## 2021-08-30 NOTE — Telephone Encounter (Signed)
Requested medication (s) are due for refill today:   Yes  Requested medication (s) are on the active medication list:   Yes except the atorvastatin dose should be 40 mg not '20mg'$   Future visit scheduled:   No   Called to schedule her physical.  Voice mailbox is full so unable to leave a messaage   Last ordered: atorvastatin is the incorrect dose, should be 40 mg, the 20 mg was discontinued on 06/27/2020;   Lisinopril 20 mg 06/26/2020 #90, 4 refills;   Synthroid 175 mcg 06/26/2020 #90, 4 refills.   Returned because does not have a valid encounter within the last year and labs are due per protocol.  Requested Prescriptions  Pending Prescriptions Disp Refills   atorvastatin (LIPITOR) 20 MG tablet [Pharmacy Med Name: ATORVASTATIN 20 MG TABLET] 90 tablet 4    Sig: TAKE 1 TABLET BY MOUTH EVERY DAY     Cardiovascular:  Antilipid - Statins Failed - 08/29/2021  3:01 PM      Failed - Valid encounter within last 12 months    Recent Outpatient Visits           1 year ago Centrilobular emphysema ([place])   [place], Jolene T, NP   1 year ago Chronic bilateral low back pain with right-sided sciatica   [place], Henrine Screws T, NP   2 years ago Centrilobular emphysema ([place])   [place], [place] T, NP   2 years ago HTN (hypertension), benign   [place], [place] T, NP   2 years ago Depression, recurrent ([place])   [place], [place] T, NP               Failed - Lipid Panel in normal range within the last 12 months    Cholesterol, Total  Date Value Ref Range Status  06/26/2020 178 100 - 199 mg/dL Final   LDL Chol Calc (NIH)  Date Value Ref Range Status  06/26/2020 104 (H) 0 - 99 mg/dL Final   HDL  Date Value Ref Range Status  06/26/2020 53 >39 mg/dL Final   Triglycerides  Date Value Ref Range Status  06/26/2020 120 0 - 149 mg/dL Final         Passed - Patient is not pregnant        lisinopril (ZESTRIL) 20 MG tablet [Pharmacy Med Name: LISINOPRIL 20 MG TABLET] 90 tablet 4    Sig: TAKE 1 TABLET BY MOUTH EVERY DAY     Cardiovascular:  ACE Inhibitors Failed - 08/29/2021  3:01 PM      Failed - Cr in normal range and within 180 days    Creatinine, Ser  Date Value Ref Range Status  06/26/2020 1.06 (H) 0.57 - 1.00 mg/dL Final         Failed - K in normal range and within 180 days    Potassium  Date Value Ref Range Status  06/26/2020 4.8 3.5 - 5.2 mmol/L Final         Failed - Valid encounter within last 6 months    Recent Outpatient Visits           1 year ago Centrilobular emphysema ([place])   [place], Jolene T, NP   1 year ago Chronic bilateral low back pain with right-sided sciatica   [place], Henrine Screws T, NP   2 years ago Centrilobular emphysema ([place])   [place], [place] T,  NP   2 years ago HTN (hypertension), benign   [place], [place] T, NP   2 years ago Depression, recurrent ([place])   [place], [place] T, NP               Passed - Patient is not pregnant      Passed - Last BP in normal range    BP Readings from Last 1 Encounters:  06/26/20 114/78          levothyroxine (SYNTHROID) 175 MCG tablet [Pharmacy Med Name: LEVOTHYROXINE 175 MCG TABLET] 90 tablet 4    Sig: TAKE 1 TABLET BY MOUTH DAILY BEFORE BREAKFAST.     Endocrinology:  Hypothyroid Agents Failed - 08/29/2021  3:01 PM      Failed - TSH in normal range and within 360 days    TSH  Date Value Ref Range Status  06/26/2020 1.770 0.450 - 4.500 uIU/mL Final         Failed - Valid encounter within last 12 months    Recent Outpatient Visits           1 year ago Centrilobular emphysema ([place])   [place], [place] T, NP   1 year ago Chronic bilateral low back pain with right-sided sciatica   [place], Henrine Screws T, NP   2 years ago  Centrilobular emphysema ([place])   [place], [place] T, NP   2 years ago HTN (hypertension), benign   [place], [place] T, NP   2 years ago Depression, recurrent ([place])   [place], Barbaraann Faster, NP

## 2021-09-02 ENCOUNTER — Other Ambulatory Visit: Payer: Self-pay

## 2021-09-02 NOTE — Telephone Encounter (Signed)
Pt called and states she need a  refill for the following sent to [facility], listed.  Requested Prescriptions   Pending Prescriptions Disp Refills   atorvastatin (LIPITOR) 40 MG tablet 90 tablet 4    Sig: Take 1 tablet (40 mg total) by mouth daily.   levothyroxine (SYNTHROID) 175 MCG tablet 90 tablet 4    Sig: Take 1 tablet (175 mcg total) by mouth daily before breakfast.   lisinopril (ZESTRIL) 20 MG tablet 90 tablet 4    Sig: Take 1 tablet (20 mg total) by mouth daily.

## 2021-09-03 MED ORDER — LEVOTHYROXINE SODIUM 175 MCG PO TABS
175.0000 ug | ORAL_TABLET | Freq: Every day | ORAL | 4 refills | Status: DC
Start: 2021-09-03 — End: 2022-11-28

## 2021-09-03 MED ORDER — ATORVASTATIN CALCIUM 40 MG PO TABS
40.0000 mg | ORAL_TABLET | Freq: Every day | ORAL | 4 refills | Status: DC
Start: 2021-09-03 — End: 2021-10-08

## 2021-09-03 MED ORDER — LISINOPRIL 20 MG PO TABS
20.0000 mg | ORAL_TABLET | Freq: Every day | ORAL | 4 refills | Status: DC
Start: 2021-09-03 — End: 2022-11-28

## 2021-09-03 NOTE — Telephone Encounter (Signed)
Requested medication (s) are due for refill today: Yes  Requested medication (s) are on the active medication list: Yes  Last refill:  06/27/20  Future visit scheduled: Yes  Notes to clinic:  Prescriptions have expired.    Requested Prescriptions  Pending Prescriptions Disp Refills   atorvastatin (LIPITOR) 40 MG tablet 90 tablet 4    Sig: Take 1 tablet (40 mg total) by mouth daily.     Cardiovascular:  Antilipid - Statins Failed - 09/02/2021 10:15 AM      Failed - Valid encounter within last 12 months    Recent Outpatient Visits           1 year ago Centrilobular emphysema ([place])   [place], Jolene T, NP   1 year ago Chronic bilateral low back pain with right-sided sciatica   [place], Henrine Screws T, NP   2 years ago Centrilobular emphysema ([place])   [place], [place] T, NP   2 years ago HTN (hypertension), benign   [place], [place] T, NP   2 years ago Depression, recurrent ([place])   [place], Barbaraann Faster, NP       Future Appointments             In 1 month Cannady, Barbaraann Faster, NP [facility]             Failed - Lipid Panel in normal range within the last 12 months    Cholesterol, Total  Date Value Ref Range Status  06/26/2020 178 100 - 199 mg/dL Final   LDL Chol Calc (NIH)  Date Value Ref Range Status  06/26/2020 104 (H) 0 - 99 mg/dL Final   HDL  Date Value Ref Range Status  06/26/2020 53 >39 mg/dL Final   Triglycerides  Date Value Ref Range Status  06/26/2020 120 0 - 149 mg/dL Final         Passed - Patient is not pregnant       levothyroxine (SYNTHROID) 175 MCG tablet 90 tablet 4    Sig: Take 1 tablet (175 mcg total) by mouth daily before breakfast.     Endocrinology:  Hypothyroid Agents Failed - 09/02/2021 10:15 AM      Failed - TSH in normal range and within 360 days    TSH  Date Value Ref Range Status  06/26/2020 1.770  0.450 - 4.500 uIU/mL Final         Failed - Valid encounter within last 12 months    Recent Outpatient Visits           1 year ago Centrilobular emphysema ([place])   [place], Jolene T, NP   1 year ago Chronic bilateral low back pain with right-sided sciatica   [place], Henrine Screws T, NP   2 years ago Centrilobular emphysema ([place])   [place], [place] T, NP   2 years ago HTN (hypertension), benign   [place], [place] T, NP   2 years ago Depression, recurrent ([place])   [place], [place] T, NP       Future Appointments             In 1 month Cannady, Jolene T, NP [facility]              lisinopril (ZESTRIL) 20 MG tablet 90 tablet 4    Sig: Take 1 tablet (20 mg  total) by mouth daily.     Cardiovascular:  ACE Inhibitors Failed - 09/02/2021 10:15 AM      Failed - Cr in normal range and within 180 days    Creatinine, Ser  Date Value Ref Range Status  06/26/2020 1.06 (H) 0.57 - 1.00 mg/dL Final         Failed - K in normal range and within 180 days    Potassium  Date Value Ref Range Status  06/26/2020 4.8 3.5 - 5.2 mmol/L Final         Failed - Valid encounter within last 6 months    Recent Outpatient Visits           1 year ago Centrilobular emphysema ([place])   [place], Jolene T, NP   1 year ago Chronic bilateral low back pain with right-sided sciatica   [place], Henrine Screws T, NP   2 years ago Centrilobular emphysema ([place])   [place], [place] T, NP   2 years ago HTN (hypertension), benign   [place], [place] T, NP   2 years ago Depression, recurrent ([place])   [place], Barbaraann Faster, NP       Future Appointments             In 1 month Cannady, [place] T, NP [facility], [place] - Patient is not  pregnant      Passed - Last BP in normal range    BP Readings from Last 1 Encounters:  06/26/20 114/78

## 2021-09-06 ENCOUNTER — Ambulatory Visit: Payer: Self-pay

## 2021-09-06 NOTE — Telephone Encounter (Signed)
   Chief Complaint: Medication question Symptoms: none Frequency:  Pertinent Negatives: Patient denies  Disposition: '[]'$ ED /'[]'$ Urgent Care (no appt availability in office) / '[]'$ Appointment(In office/virtual)/ '[]'$  [facility]/ '[]'$ Home Care/ '[]'$ Refused Recommended Disposition /'[]'$ [facility]/ x Follow-up with PCP Additional Notes: Pt was unaware that Atorvastin dose was increased to 40 mg. Shared provider's note with pt.     Tiffany L Reel, CMA  06/27/2020 10:11 AM EDT Back to Top    Patient notified.    Please send in new prescription.    Venita Lick, NP  06/27/2020  8:55 AM EDT     Good morning please let Katrina Curtis know her labs have returned and overall continue to be stable.  Her creatinine, one of her kidney labs, was mildly elevated.  Would recommend increased fluid intake at home.  Thyroid labs normal, can continue current Levothyroxine dosing.  Vitamin D level is normal, continue supplement.  Urine was negative for infection.  CBC showed no anemia.  LDL was mildly elevated, I would like to increase Atorvastatin to 40 MG, if she has 20 MG tablets left she can start taking 2 of them daily and I will send in new 40 MG script, then recheck next visit.  Any questions? Keep being awesome!!  Thank you for allowing me to participate in your care. Kindest regards, Jolene    Summary: medication questions   Pt called in about her medication atorvastatin (LIPITOR) 40 MG tablet, and has a few questions, please advise.      Reason for Disposition  Caller has medicine question only, adult not sick, AND triager answers question  Answer Assessment - Initial Assessment Questions 1. NAME of MEDICATION: "What medicine are you calling about?"     Atorvastatin '40mg'$  2. QUESTION: "What is your question?" (e.g., double dose of medicine, side effect)     Pt was taking 20 mg does not remember a change in dose 3. PRESCRIBING HCP: "Who prescribed it?" Reason: if prescribed by  specialist, call should be referred to that group.     Jolene Cannady 4. SYMPTOMS: "Do you have any symptoms?"     no 5. SEVERITY: If symptoms are present, ask "Are they mild, moderate or severe?"     na 6. PREGNANCY:  "Is there any chance that you are pregnant?" "When was your last menstrual period?"     na  Protocols used: Medication Question Call-A-AH

## 2021-09-20 DIAGNOSIS — N3946 Mixed incontinence: Secondary | ICD-10-CM | POA: Diagnosis not present

## 2021-09-20 DIAGNOSIS — N3281 Overactive bladder: Secondary | ICD-10-CM | POA: Diagnosis not present

## 2021-09-29 NOTE — Patient Instructions (Signed)
Please call to schedule your mammogram and/or bone density: [facility] [place]: [id number], [place], [place] [address] Phone: [phone]   Eating Plan for Chronic Obstructive Pulmonary Disease Chronic obstructive pulmonary disease (COPD) causes symptoms such as shortness of breath, coughing, and chest discomfort. These symptoms can make it difficult to eat enough to maintain a healthy weight. Generally, people with COPD should eat a diet that is high in calories, protein, and other nutrients to maintain body weight and to keep the lungs as healthy as possible. Depending on the medicines you take and other health conditions you may have, your health care provider may give you additional recommendations on what to eat or avoid. Talk with your health care provider about your goals for body weight, and work with a dietitian to develop an eating plan that is right for you. What are tips for following this plan? Reading food labels  Avoid foods with more than 300 milligrams (mg) of salt (sodium) per serving. Choose foods that contain at least 4 grams (g) of fiber per serving. Try to eat 20-30 g of fiber each day. Choose foods that are high in calories and protein, such as nuts, beans, yogurt, and cheese. Shopping Do not buy foods labeled as diet, low-calorie, or low-fat. If you are able to eat dairy products: Avoid low-fat or skim milk. Buy dairy products that have at least 2% fat. Buy nutritional supplement drinks. Buy grains and prepared foods labeled as enriched or fortified. Consider buying low-sodium, pre-made foods to conserve energy for eating. Cooking Add dry milk or protein powder to smoothies. Cook with healthy fats, such as olive oil, canola oil, sunflower oil, and grapeseed oil. Add oil, butter, cream cheese, or nut butters to foods to increase fat and calories. To make foods easier to chew and swallow: Cook vegetables, pasta, and  rice until soft. Cut or grind meat into very small pieces. Dip breads in liquid. Meal planning  Eat when you feel hungry. Eat 5-6 small meals throughout the day. Drink 6-8 glasses of water each day. Do not drink liquids with meals. Drink liquids at the end of the meal to avoid feeling full too quickly. Eat a variety of fruits and vegetables every day. Ask for assistance from family or friends with planning and preparing meals as needed. Avoid foods that cause you to feel bloated, such as carbonated drinks, fried foods, beans, broccoli, cabbage, and apples. For older adults, ask your local agency on aging whether you are eligible for meal assistance programs, such as Meals on Wheels. Lifestyle  Do not smoke. Eat slowly. Take small bites and chew food well before swallowing. Do not overeat. This may make it more difficult to breathe after eating. Sit up while eating. If needed, continue to use supplemental oxygen while eating. Rest or relax for 30 minutes before and after eating. Monitor your weight as told by your health care provider. Exercise as told by your health care provider. What foods should I eat? Fruits All fresh, dried, canned, or frozen fruits that do not cause gas. Vegetables All fresh, canned (no salt added), or frozen vegetables that do not cause gas. Grains Whole-grain bread. Enriched whole-grain pasta. Fortified whole-grain cereals. Fortified rice. Quinoa. Meats and other proteins Lean meat. Poultry. Fish. Dried beans. Unsalted nuts. Tofu. Eggs. Nut butters. Dairy Whole or 2% milk. Cheese. Yogurt. Fats and oils Olive oil. Canola oil. Butter. Margarine. Beverages Water. Vegetable juice (no salt added). Decaffeinated  coffee. Decaffeinated or herbal tea. Seasonings and condiments Fresh or dried herbs. Low-salt or salt-free seasonings. Low-sodium soy sauce. The items listed above may not be a complete list of foods and beverages you can eat. Contact a dietitian for  more information. What foods should I avoid? Fruits Fruits that cause gas, such as apples or melon. Vegetables Vegetables that cause gas, such as broccoli, Brussels sprouts, cabbage, cauliflower, and onions. Canned vegetables with added salt. Meats and other proteins Fried meat. Salt-cured meat. Processed meat. Dairy Fat-free or low-fat milk, yogurt, or cheese. Processed cheese. Beverages Carbonated drinks. Caffeinated drinks, such as coffee, tea, and soft drinks. Juice. Alcohol. Vegetable juice with added salt. Seasonings and condiments Salt. Seasoning mixes with salt. Soy sauce. Angie Fava. Other foods Clear soup or broth. Fried foods. Prepared frozen meals. The items listed above may not be a complete list of foods and beverages you should avoid. Contact a dietitian for more information. Summary COPD symptoms can make it difficult to eat enough to maintain a healthy weight. A COPD eating plan can help you maintain your body weight and keep your lungs as healthy as possible. Eat a diet that is high in calories, protein, and other nutrients. Read labels to make sure that you are getting the right nutrients. Cook foods to make them easier to chew and swallow. Eat 5-6 small meals throughout the day, and avoid foods that cause gas or make you feel bloated. This information is not intended to replace advice given to you by your health care provider. Make sure you discuss any questions you have with your health care provider. Document Revised: 01/24/2020 Document Reviewed: 01/24/2020 Elsevier Patient Education  [place].

## 2021-10-03 ENCOUNTER — Encounter: Payer: Self-pay | Admitting: Nurse Practitioner

## 2021-10-03 ENCOUNTER — Ambulatory Visit: Payer: Medicaid Other | Admitting: Nurse Practitioner

## 2021-10-03 VITALS — BP 105/71 | HR 72 | Temp 98.1°F | Ht 67.5 in | Wt 218.6 lb

## 2021-10-03 DIAGNOSIS — I10 Essential (primary) hypertension: Secondary | ICD-10-CM

## 2021-10-03 DIAGNOSIS — Z23 Encounter for immunization: Secondary | ICD-10-CM | POA: Diagnosis not present

## 2021-10-03 DIAGNOSIS — Z1159 Encounter for screening for other viral diseases: Secondary | ICD-10-CM

## 2021-10-03 DIAGNOSIS — J432 Centrilobular emphysema: Secondary | ICD-10-CM | POA: Diagnosis not present

## 2021-10-03 DIAGNOSIS — M542 Cervicalgia: Secondary | ICD-10-CM | POA: Insufficient documentation

## 2021-10-03 DIAGNOSIS — I2510 Atherosclerotic heart disease of native coronary artery without angina pectoris: Secondary | ICD-10-CM | POA: Diagnosis not present

## 2021-10-03 DIAGNOSIS — N1831 Chronic kidney disease, stage 3a: Secondary | ICD-10-CM

## 2021-10-03 DIAGNOSIS — M25512 Pain in left shoulder: Secondary | ICD-10-CM | POA: Diagnosis not present

## 2021-10-03 DIAGNOSIS — M21611 Bunion of right foot: Secondary | ICD-10-CM | POA: Insufficient documentation

## 2021-10-03 DIAGNOSIS — Z114 Encounter for screening for human immunodeficiency virus [HIV]: Secondary | ICD-10-CM

## 2021-10-03 DIAGNOSIS — Z124 Encounter for screening for malignant neoplasm of cervix: Secondary | ICD-10-CM

## 2021-10-03 DIAGNOSIS — I700 Atherosclerosis of aorta: Secondary | ICD-10-CM

## 2021-10-03 DIAGNOSIS — N3281 Overactive bladder: Secondary | ICD-10-CM

## 2021-10-03 DIAGNOSIS — F339 Major depressive disorder, recurrent, unspecified: Secondary | ICD-10-CM | POA: Diagnosis not present

## 2021-10-03 DIAGNOSIS — Z0000 Encounter for general adult medical examination without abnormal findings: Secondary | ICD-10-CM

## 2021-10-03 DIAGNOSIS — Z1231 Encounter for screening mammogram for malignant neoplasm of breast: Secondary | ICD-10-CM

## 2021-10-03 DIAGNOSIS — F17210 Nicotine dependence, cigarettes, uncomplicated: Secondary | ICD-10-CM

## 2021-10-03 DIAGNOSIS — I77810 Thoracic aortic ectasia: Secondary | ICD-10-CM | POA: Diagnosis not present

## 2021-10-03 DIAGNOSIS — Z6833 Body mass index (BMI) 33.0-33.9, adult: Secondary | ICD-10-CM

## 2021-10-03 DIAGNOSIS — M21612 Bunion of left foot: Secondary | ICD-10-CM

## 2021-10-03 DIAGNOSIS — E782 Mixed hyperlipidemia: Secondary | ICD-10-CM | POA: Diagnosis not present

## 2021-10-03 DIAGNOSIS — E039 Hypothyroidism, unspecified: Secondary | ICD-10-CM | POA: Diagnosis not present

## 2021-10-03 DIAGNOSIS — E6609 Other obesity due to excess calories: Secondary | ICD-10-CM

## 2021-10-03 LAB — URINALYSIS, ROUTINE W REFLEX MICROSCOPIC
Bilirubin, UA: NEGATIVE
Glucose, UA: NEGATIVE
Ketones, UA: NEGATIVE
Nitrite, UA: NEGATIVE
Protein,UA: NEGATIVE
RBC, UA: NEGATIVE
Specific Gravity, UA: 1.025 (ref 1.005–1.030)
Urobilinogen, Ur: 0.2 mg/dL (ref 0.2–1.0)
pH, UA: 5.5 (ref 5.0–7.5)

## 2021-10-03 LAB — MICROSCOPIC EXAMINATION
Bacteria, UA: NONE SEEN
RBC, Urine: NONE SEEN /hpf (ref 0–2)

## 2021-10-03 LAB — MICROALBUMIN, URINE WAIVED
Creatinine, Urine Waived: 100 mg/dL (ref 10–300)
Microalb, Ur Waived: 10 mg/L (ref 0–19)
Microalb/Creat Ratio: <30 mg/g (ref <30)

## 2021-10-03 MED ORDER — UMECLIDINIUM-VILANTEROL 62.5-25 MCG/ACT IN AEPB: 1.0000 | INHALATION_SPRAY | Freq: Every day | RESPIRATORY_TRACT | 4 refills | Status: DC

## 2021-10-03 MED ORDER — SERTRALINE HCL 100 MG PO TABS
200.0000 mg | ORAL_TABLET | Freq: Every day | ORAL | 4 refills | Status: DC
Start: 2021-10-03 — End: 2022-11-27

## 2021-10-03 MED ORDER — GABAPENTIN 300 MG PO CAPS
ORAL_CAPSULE | ORAL | 4 refills | Status: DC
Start: 2021-10-03 — End: 2022-12-23

## 2021-10-03 MED ORDER — MELOXICAM 7.5 MG PO TABS
ORAL_TABLET | ORAL | 3 refills | Status: DC
Start: 2021-10-03 — End: 2022-08-05

## 2021-10-03 NOTE — Assessment & Plan Note (Signed)
Chronic, ongoing.  Continue current medication regimen and adjust as needed.  Lipid panel today.  Recommend complete cessation of smoking for prevention.

## 2021-10-03 NOTE — Assessment & Plan Note (Signed)
Noted on lung screening in 2021, remained stable with recommendation to repeat in 12 months.  She missed this in 2022, will reach out to lung screening team to get scheduled -- discussed with patient.

## 2021-10-03 NOTE — Assessment & Plan Note (Signed)
Chronic, stable.  Continue Lisinopril for kidney protection, but consider change to ARB due to underlying lung disease.  CMP today.  If worsening or decline in function consider referral to nephrology.

## 2021-10-03 NOTE — Assessment & Plan Note (Signed)
Chronic, ongoing.  Recommend continuing Atorvastatin and taking a low dose, ASA chewable 81 MG daily.

## 2021-10-03 NOTE — Assessment & Plan Note (Signed)
Chronic, ongoing.  Continue current medication regimen and adjust as needed.  Thyroid labs today. 

## 2021-10-03 NOTE — Assessment & Plan Note (Signed)
I have recommended complete cessation of tobacco use. I have discussed various options available for assistance with tobacco cessation including over the counter methods (Nicotine gum, patch and lozenges). We also discussed prescription options (Chantix, Nicotine Inhaler / Nasal Spray). The patient is not interested in pursuing any prescription tobacco cessation options at this time.  

## 2021-10-03 NOTE — Assessment & Plan Note (Signed)
Ongoing for weeks per report with decreased ROM and strength on exam, concern for rotator cuff injury.  Referral to ortho placed and refills on Meloxicam.  Will benefit further ortho assessment and recommendations.

## 2021-10-03 NOTE — Assessment & Plan Note (Signed)
Would benefit referral to podiatry and possible surgical repair, as does affect gait, however she wishes to hold off at this time -- consider referral in future.

## 2021-10-03 NOTE — Assessment & Plan Note (Signed)
Chronic, ongoing with continued worsening.  No benefit from Ditropan XL.  UA today.  Recommend complete cessation of smoking and educated on diet changes to assist with symptoms.  New referral to urology placed for further assessment and recommendations due to ongoing symptoms with worsening.  Would benefit their recommendations.

## 2021-10-03 NOTE — Assessment & Plan Note (Signed)
BMI 33.73.  Recommended eating smaller high protein, low fat meals more frequently and exercising 30 mins a day 5 times a week with a goal of 10-15lb weight loss in the next 3 months. Patient voiced their understanding and motivation to adhere to these recommendations.
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
## 2021-10-03 NOTE — Assessment & Plan Note (Signed)
Chronic, stable.  BP below goal on exam.  Will continue Lisinopril 20 MG daily at this time for kidney protection, consider reduction if lower readings and symptomatic -- will also consider change to ARB as is a smoker with lung disease present.  Recommend complete cessation smoking.  Recommend she monitor BP at least a few mornings a week at home and document.  DASH diet at home.  Labs today: CMP, TSH. UA.

## 2021-10-03 NOTE — Assessment & Plan Note (Signed)
Chronic, ongoing.  No recent exacerbations, however poor control and only using Albuterol.  Educated her at length of differences between inhalers and what to use when.  At this time will stop Symbicort, as is not using, and start Anoro (may offer more benefit for mucus reduction) and continue Albuterol as needed only.  Discussed at length with patient.  Spirometry next visit for yearly exam.  Recommend she schedule her yearly CT lung screening + recommend complete cessation of smoking.

## 2021-10-03 NOTE — Assessment & Plan Note (Signed)
Noted on CT lung 2021.  Recommend continued statin use daily + recommend complete cessation of smoking.  Discussed benefit of adding on daily Baby ASA for prevention, recommend adding this on.

## 2021-10-03 NOTE — Assessment & Plan Note (Signed)
Chronic, stable.  Denies SI/HI.  Continue current medication regimen and adjust as needed.  Monitor NA+ level, CMP today.

## 2021-10-03 NOTE — Assessment & Plan Note (Signed)
Ongoing, from right side neck to shoulder area.  Discomfort.  Recommend she restart Gabapentin and will adjust as needed.  Tylenol as needed at home + application of heat.  Return for worsening or ongoing.

## 2021-10-04 LAB — CBC WITH DIFFERENTIAL/PLATELET
Basophils Absolute: 0.1 10*3/uL (ref 0.0–0.2)
Basos: 1 %
EOS (ABSOLUTE): 0.1 10*3/uL (ref 0.0–0.4)
Eos: 1 %
Hematocrit: 39.1 % (ref 34.0–46.6)
Hemoglobin: 13.1 g/dL (ref 11.1–15.9)
Immature Grans (Abs): 0 10*3/uL (ref 0.0–0.1)
Immature Granulocytes: 0 %
Lymphocytes Absolute: 2.3 10*3/uL (ref 0.7–3.1)
Lymphs: 29 %
MCH: 30.7 pg (ref 26.6–33.0)
MCHC: 33.5 g/dL (ref 31.5–35.7)
MCV: 92 fL (ref 79–97)
Monocytes Absolute: 0.4 10*3/uL (ref 0.1–0.9)
Monocytes: 5 %
Neutrophils Absolute: 5.2 10*3/uL (ref 1.4–7.0)
Neutrophils: 64 %
Platelets: 153 10*3/uL (ref 150–450)
RBC: 4.27 x10E6/uL (ref 3.77–5.28)
RDW: 13.5 % (ref 11.7–15.4)
WBC: 8.1 10*3/uL (ref 3.4–10.8)

## 2021-10-04 LAB — COMPREHENSIVE METABOLIC PANEL
ALT: 14 IU/L (ref 0–32)
AST: 16 IU/L (ref 0–40)
Albumin/Globulin Ratio: 1.6 (ref 1.2–2.2)
Albumin: 4.6 g/dL (ref 3.8–4.9)
Alkaline Phosphatase: 88 IU/L (ref 44–121)
BUN/Creatinine Ratio: 15 (ref 9–23)
BUN: 15 mg/dL (ref 6–24)
Bilirubin Total: 0.6 mg/dL (ref 0.0–1.2)
CO2: 22 mmol/L (ref 20–29)
Calcium: 9.8 mg/dL (ref 8.7–10.2)
Chloride: 102 mmol/L (ref 96–106)
Creatinine, Ser: 1.01 mg/dL — ABNORMAL HIGH (ref 0.57–1.00)
Globulin, Total: 2.8 g/dL (ref 1.5–4.5)
Glucose: 100 mg/dL — ABNORMAL HIGH (ref 70–99)
Potassium: 4.3 mmol/L (ref 3.5–5.2)
Sodium: 139 mmol/L (ref 134–144)
Total Protein: 7.4 g/dL (ref 6.0–8.5)
eGFR: 64 mL/min/{1.73_m2} (ref >59)

## 2021-10-04 LAB — LIPID PANEL W/O CHOL/HDL RATIO
Cholesterol, Total: 181 mg/dL (ref 100–199)
HDL: 59 mg/dL (ref >39)
LDL Chol Calc (NIH): 95 mg/dL (ref 0–99)
Triglycerides: 157 mg/dL — ABNORMAL HIGH (ref 0–149)
VLDL Cholesterol Cal: 27 mg/dL (ref 5–40)

## 2021-10-04 LAB — HIV ANTIBODY (ROUTINE TESTING W REFLEX): HIV Screen 4th Generation wRfx: NONREACTIVE

## 2021-10-04 LAB — T4, FREE: Free T4: 1.36 ng/dL (ref 0.82–1.77)

## 2021-10-04 LAB — THYROID PEROXIDASE ANTIBODY: Thyroperoxidase Ab SerPl-aCnc: 19 IU/mL (ref 0–34)

## 2021-10-04 LAB — TSH: TSH: 1.13 u[IU]/mL (ref 0.450–4.500)

## 2021-10-04 LAB — HEPATITIS C ANTIBODY: Hep C Virus Ab: NONREACTIVE

## 2021-10-04 NOTE — Progress Notes (Signed)
Good afternoon, please let Cyril know labs have returned: - Kidney and liver function remain stable. - Cholesterol levels stable, continue Atorvastatin daily. - CBC shows no anemia or infection. - Thyroid labs are stable, continue current Levothyroxine dosing.  No changes needed.  Please ensure to schedule with urology when they call and ortho.  Any questions? Keep being awesome!!  Thank you for allowing me to participate in your care.  I appreciate you. Kindest regards, Saliha Salts

## 2021-10-07 ENCOUNTER — Other Ambulatory Visit: Payer: Self-pay | Admitting: Nurse Practitioner

## 2021-10-08 ENCOUNTER — Other Ambulatory Visit: Payer: Self-pay | Admitting: Nurse Practitioner

## 2021-10-08 MED ORDER — ATORVASTATIN CALCIUM 40 MG PO TABS: 40.0000 mg | ORAL_TABLET | Freq: Every day | ORAL | 4 refills | Status: DC

## 2021-10-08 MED ORDER — ALBUTEROL SULFATE HFA 108 (90 BASE) MCG/ACT IN AERS
1.0000 | INHALATION_SPRAY | Freq: Four times a day (QID) | RESPIRATORY_TRACT | 3 refills | Status: DC | PRN
Start: 2021-10-08 — End: 2022-11-27

## 2021-10-08 NOTE — Telephone Encounter (Signed)
Requested medication (s) are due for refill today: no  Requested medication (s) are on the active medication list: no  Last refill:  06/26/20  Future visit scheduled: yes  Notes to clinic:  Unable to refill per protocol, Rx expired. Both medication were discontinued by PCP.      Requested Prescriptions  Pending Prescriptions Disp Refills   SYMBICORT 160-4.5 MCG/ACT inhaler [Pharmacy Med Name: SYMBICORT 160-4.5 MCG INHALER] 10.2 each 6    Sig: TAKE 2 PUFFS BY MOUTH TWICE A DAY     Pulmonology:  Combination Products Passed - 10/07/2021 12:40 PM      Passed - Valid encounter within last 12 months    Recent Outpatient Visits           5 days ago Centrilobular emphysema ([place])   [place] Cannady, Henrine Screws T, NP   1 year ago Centrilobular emphysema ([place])   [place], [place] T, NP   2 years ago Chronic bilateral low back pain with right-sided sciatica   [place], Henrine Screws T, NP   2 years ago Centrilobular emphysema ([place])   [place], [place] T, NP   2 years ago HTN (hypertension), benign   [place], Barbaraann Faster, NP       Future Appointments             Tomorrow [place], MD [place]   In 1 month Cannady, Barbaraann Faster, NP [place], [facility]             atorvastatin (LIPITOR) 20 MG tablet [Pharmacy Med Name: ATORVASTATIN 20 MG TABLET] 90 tablet 4    Sig: TAKE 1 TABLET BY MOUTH EVERY DAY     Cardiovascular:  Antilipid - Statins Failed - 10/07/2021 12:40 PM      Failed - Lipid Panel in normal range within the last 12 months    Cholesterol, Total  Date Value Ref Range Status  10/03/2021 181 100 - 199 mg/dL Final   LDL Chol Calc (NIH)  Date Value Ref Range Status  10/03/2021 95 0 - 99 mg/dL Final   HDL  Date Value Ref Range Status  10/03/2021 59 >39 mg/dL Final   Triglycerides  Date Value Ref Range Status  10/03/2021 157 (H)  0 - 149 mg/dL Final         Passed - Patient is not pregnant      Passed - Valid encounter within last 12 months    Recent Outpatient Visits           5 days ago Centrilobular emphysema ([place])   [place], Henrine Screws T, NP   1 year ago Centrilobular emphysema ([place])   [place], [place] T, NP   2 years ago Chronic bilateral low back pain with right-sided sciatica   [place], Henrine Screws T, NP   2 years ago Centrilobular emphysema ([place])   [place], [place] T, NP   2 years ago HTN (hypertension), benign   [place], Barbaraann Faster, NP       Future Appointments             Tomorrow Sninsky, Herbert Seta, MD [facility]   In 1 month Cannady, Barbaraann Faster, NP [facility], [facility]

## 2021-10-09 ENCOUNTER — Other Ambulatory Visit: Payer: Self-pay | Admitting: Nurse Practitioner

## 2021-10-09 ENCOUNTER — Ambulatory Visit (INDEPENDENT_AMBULATORY_CARE_PROVIDER_SITE_OTHER): Payer: Medicaid Other | Admitting: Urology

## 2021-10-09 ENCOUNTER — Encounter: Payer: Self-pay | Admitting: Urology

## 2021-10-09 VITALS — BP 148/88 | HR 75 | Ht 69.0 in | Wt 217.0 lb

## 2021-10-09 DIAGNOSIS — N393 Stress incontinence (female) (male): Secondary | ICD-10-CM | POA: Diagnosis not present

## 2021-10-09 DIAGNOSIS — N3281 Overactive bladder: Secondary | ICD-10-CM | POA: Diagnosis not present

## 2021-10-09 LAB — BLADDER SCAN AMB NON-IMAGING

## 2021-10-09 NOTE — Telephone Encounter (Signed)
Change in therapy 06/27/20 Requested Prescriptions  Pending Prescriptions Disp Refills  . atorvastatin (LIPITOR) 20 MG tablet [Pharmacy Med Name: ATORVASTATIN 20 MG TABLET] 90 tablet 4    Sig: TAKE 1 TABLET BY MOUTH EVERY DAY     Cardiovascular:  Antilipid - Statins Failed - 10/09/2021 11:54 AM      Failed - Lipid Panel in normal range within the last 12 months    Cholesterol, Total  Date Value Ref Range Status  10/03/2021 181 100 - 199 mg/dL Final   LDL Chol Calc (NIH)  Date Value Ref Range Status  10/03/2021 95 0 - 99 mg/dL Final   HDL  Date Value Ref Range Status  10/03/2021 59 >39 mg/dL Final   Triglycerides  Date Value Ref Range Status  10/03/2021 157 (H) 0 - 149 mg/dL Final         Passed - Patient is not pregnant      Passed - Valid encounter within last 12 months    Recent Outpatient Visits          6 days ago Centrilobular emphysema ([place])   [place], Henrine Screws T, NP   1 year ago Centrilobular emphysema ([place])   [place], [place] T, NP   2 years ago Chronic bilateral low back pain with right-sided sciatica   [place], Henrine Screws T, NP   2 years ago Centrilobular emphysema ([place])   [place], [place] T, NP   2 years ago HTN (hypertension), benign   [place], Barbaraann Faster, NP      Future Appointments            In 1 month [place]an, Hunt Oris, PA-C [place]   In 1 month [place], Barbaraann Faster, NP [employer] [employer], [facility]
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
## 2021-10-09 NOTE — Patient Instructions (Signed)

## 2021-10-11 ENCOUNTER — Telehealth: Payer: Self-pay | Admitting: *Deleted

## 2021-10-11 ENCOUNTER — Encounter: Payer: Self-pay | Admitting: *Deleted

## 2021-10-11 NOTE — Telephone Encounter (Signed)
Attempted to contact pt to schedule lung screening CT but unable to leave a message due to voicemail being full. Letter mailed to pt asking her to contact us to get this scheduled.

## 2021-11-04 DIAGNOSIS — N3281 Overactive bladder: Secondary | ICD-10-CM | POA: Diagnosis not present

## 2021-11-04 DIAGNOSIS — N3946 Mixed incontinence: Secondary | ICD-10-CM | POA: Diagnosis not present

## 2021-11-11 ENCOUNTER — Ambulatory Visit: Payer: Medicaid Other | Admitting: Urology

## 2021-11-11 NOTE — Progress Notes (Incomplete)
11/11/21 7:46 AM   Katrina Curtis Dec 06, 1960 [id number]  Referring provider:  Venita Lick, NP [address] [place],  [place] [address] No chief complaint on file.   Urological history  Urge incontinence - Had initial improvement on oxybutynin 10 mg XL  - No tried on Myrbetriq 50 mg daily on 10/09/2021 by Dr Diamantina Providence.    HPI: Katrina Curtis is a 61 y.o.female who presents tody for a 4 week follow-up in urge incontinence.        PMH: Past Medical History:  Diagnosis Date   Arthritis    COPD (chronic obstructive pulmonary disease) ([place])    Depression    Hyperlipidemia    Hypertension    Thyroid disease     Surgical History: Past Surgical History:  Procedure Laterality Date   broken bones     COLONOSCOPY WITH PROPOFOL N/A 04/26/2018   Procedure: COLONOSCOPY WITH PROPOFOL;  Surgeon: Virgel Manifold, MD;  Location: [facility] ENDOSCOPY;  Service: Endoscopy;  Laterality: N/A;   COLONOSCOPY WITH PROPOFOL N/A 08/24/2019   Procedure: COLONOSCOPY WITH PROPOFOL;  Surgeon: Virgel Manifold, MD;  Location: [facility] ENDOSCOPY;  Service: Endoscopy;  Laterality: N/A;   TUBAL LIGATION      Home Medications:  Allergies as of 11/11/2021       Reactions   Penicillins Other (See Comments)   Unknown- thinks its possibly a rash        Medication List        Accurate as of November 11, 2021  7:46 AM. If you have any questions, ask your nurse or doctor.          albuterol 108 (90 Base) MCG/ACT inhaler Commonly known as: VENTOLIN HFA Inhale 1-2 puffs into the lungs every 6 (six) hours as needed for wheezing or shortness of breath.   atorvastatin 40 MG tablet Commonly known as: LIPITOR Take 1 tablet (40 mg total) by mouth daily.   cholecalciferol 25 MCG (1000 UNIT) tablet Commonly known as: VITAMIN D3 Take 1,000 Units by mouth daily.   gabapentin 300 MG capsule Commonly known as: NEURONTIN TAKE 1 CAPSULE BY MOUTH EVERYDAY AT BEDTIME   levothyroxine 175 MCG  tablet Commonly known as: SYNTHROID Take 1 tablet (175 mcg total) by mouth daily before breakfast.   lisinopril 20 MG tablet Commonly known as: ZESTRIL Take 1 tablet (20 mg total) by mouth daily.   meloxicam 7.5 MG tablet Commonly known as: MOBIC TAKE 1 TABLET (7.5 MG TOTAL) BY MOUTH ONCE A DAY AS NEEDED ONLY   sertraline 100 MG tablet Commonly known as: ZOLOFT Take 2 tablets (200 mg total) by mouth daily.   umeclidinium-vilanterol 62.5-25 MCG/ACT Aepb Commonly known as: ANORO ELLIPTA Inhale 1 puff into the lungs daily at 6 (six) AM.        Allergies:  Allergies  Allergen Reactions   Penicillins Other (See Comments)    Unknown- thinks its possibly a rash    Family History: Family History  Problem Relation Age of Onset   Heart attack Mother    Heart attack Father     Social History:  reports that she has been smoking cigarettes. She has a 41.00 pack-year smoking history. She has been exposed to tobacco smoke. She has never used smokeless tobacco. She reports current alcohol use of about 6.0 standard drinks of alcohol per week. She reports that she does not currently use drugs.   Physical Exam: There were no vitals taken for this visit.  Constitutional:  Alert  and oriented, No acute distress. HEENT: [place] AT, moist mucus membranes.  Trachea midline, no masses. Cardiovascular: No clubbing, cyanosis, or edema. Respiratory: Normal respiratory effort, no increased work of breathing. GI: Abdomen is soft, nontender, nondistended, no abdominal masses GU: No CVA tenderness Lymph: No cervical or inguinal lymphadenopathy. Skin: No rashes, bruises or suspicious lesions. Neurologic: Grossly intact, no focal deficits, moving all 4 extremities. Psychiatric: Normal mood and affect.  Laboratory Data:  Lab Results  Component Value Date   CREATININE 1.01 (H) 10/03/2021     No results found for: "HGBA1C"  Urinalysis   Pertinent Imaging:   Assessment & Plan:     No  follow-ups on file.  [facility] [address] [place], [place] [address] [phone]  I,Kailey Littlejohn,acting as a scribe for [facility], PA-C.,have documented all relevant documentation on the behalf of SHANNON MCGOWAN, PA-C,as directed by  [facility], PA-C while in the presence of [place], PA-C.

## 2021-11-28 ENCOUNTER — Ambulatory Visit: Payer: Medicaid Other | Admitting: Nurse Practitioner

## 2021-11-28 DIAGNOSIS — N3281 Overactive bladder: Secondary | ICD-10-CM

## 2021-11-28 DIAGNOSIS — J432 Centrilobular emphysema: Secondary | ICD-10-CM

## 2021-11-28 DIAGNOSIS — M25512 Pain in left shoulder: Secondary | ICD-10-CM

## 2021-12-09 ENCOUNTER — Other Ambulatory Visit: Payer: Self-pay | Admitting: Nurse Practitioner

## 2021-12-10 NOTE — Telephone Encounter (Signed)
Atorvastatin dosage was changed to '40mg'$ .  Symbicort was d/c'd 10/03/2021. Requested Prescriptions  Pending Prescriptions Disp Refills  . atorvastatin (LIPITOR) 20 MG tablet [Pharmacy Med Name: ATORVASTATIN 20 MG TABLET] 90 tablet 4    Sig: TAKE 1 TABLET BY MOUTH EVERY DAY     Cardiovascular:  Antilipid - Statins Failed - 12/09/2021  1:25 PM      Failed - Lipid Panel in normal range within the last 12 months    Cholesterol, Total  Date Value Ref Range Status  10/03/2021 181 100 - 199 mg/dL Final   LDL Chol Calc (NIH)  Date Value Ref Range Status  10/03/2021 95 0 - 99 mg/dL Final   HDL  Date Value Ref Range Status  10/03/2021 59 >39 mg/dL Final   Triglycerides  Date Value Ref Range Status  10/03/2021 157 (H) 0 - 149 mg/dL Final         Passed - Patient is not pregnant      Passed - Valid encounter within last 12 months    Recent Outpatient Visits          2 months ago Centrilobular emphysema ([place])   [place], Henrine Screws T, NP   1 year ago Centrilobular emphysema ([place])   [place], [place] T, NP   2 years ago Chronic bilateral low back pain with right-sided sciatica   [place], Henrine Screws T, NP   2 years ago Centrilobular emphysema ([place])   [place], [place] T, NP   3 years ago HTN (hypertension), benign   [place], Jolene T, NP             . budesonide-formoterol (SYMBICORT) 160-4.5 MCG/ACT inhaler [Pharmacy Med Name: SYMBICORT 160-4.5 MCG INHALER] 10.2 each 6    Sig: TAKE 2 PUFFS BY MOUTH TWICE A DAY     Pulmonology:  Combination Products Passed - 12/09/2021  1:25 PM      Passed - Valid encounter within last 12 months    Recent Outpatient Visits          2 months ago Centrilobular emphysema ([place])   [place], Henrine Screws T, NP   1 year ago Centrilobular emphysema ([place])   [place], [place] T, NP   2 years ago  Chronic bilateral low back pain with right-sided sciatica   [place], Henrine Screws T, NP   2 years ago Centrilobular emphysema ([place])   [place], Lesslie T, NP   3 years ago HTN (hypertension), benign   [place], Barbaraann Faster, NP

## 2021-12-12 ENCOUNTER — Other Ambulatory Visit: Payer: Self-pay | Admitting: Nurse Practitioner

## 2021-12-13 NOTE — Telephone Encounter (Signed)
Unable to refill per protocol, Rx expired. Medication was discontinued by PCP. Will refuse duplicate requests.. Requested Prescriptions  Pending Prescriptions Disp Refills  . budesonide-formoterol (SYMBICORT) 160-4.5 MCG/ACT inhaler [Pharmacy Med Name: SYMBICORT 160-4.5 MCG INHALER] 10.2 each 6    Sig: TAKE 2 PUFFS BY MOUTH TWICE A DAY     Pulmonology:  Combination Products Passed - 12/12/2021 10:23 AM      Passed - Valid encounter within last 12 months    Recent Outpatient Visits          2 months ago Centrilobular emphysema ([place])   [place], Henrine Screws T, NP   1 year ago Centrilobular emphysema ([place])   [place], [place] T, NP   2 years ago Chronic bilateral low back pain with right-sided sciatica   [place], Henrine Screws T, NP   2 years ago Centrilobular emphysema ([place])   [place], Ollie T, NP   3 years ago HTN (hypertension), benign   [place], [place] T, NP             . atorvastatin (LIPITOR) 20 MG tablet [Pharmacy Med Name: ATORVASTATIN 20 MG TABLET] 90 tablet 4    Sig: TAKE 1 TABLET BY MOUTH EVERY DAY     Cardiovascular:  Antilipid - Statins Failed - 12/12/2021 10:23 AM      Failed - Lipid Panel in normal range within the last 12 months    Cholesterol, Total  Date Value Ref Range Status  10/03/2021 181 100 - 199 mg/dL Final   LDL Chol Calc (NIH)  Date Value Ref Range Status  10/03/2021 95 0 - 99 mg/dL Final   HDL  Date Value Ref Range Status  10/03/2021 59 >39 mg/dL Final   Triglycerides  Date Value Ref Range Status  10/03/2021 157 (H) 0 - 149 mg/dL Final         Passed - Patient is not pregnant      Passed - Valid encounter within last 12 months    Recent Outpatient Visits          2 months ago Centrilobular emphysema ([place])   [place], Barbaraann Faster, NP   1 year ago Centrilobular emphysema ([place])   [place], [place] T, NP   2 years ago Chronic bilateral low back pain with right-sided sciatica   [place], Henrine Screws T, NP   2 years ago Centrilobular emphysema ([place])   [place], [place] T, NP   3 years ago HTN (hypertension), benign   [place], Barbaraann Faster, NP

## 2022-01-13 DIAGNOSIS — N3281 Overactive bladder: Secondary | ICD-10-CM | POA: Diagnosis not present

## 2022-01-13 DIAGNOSIS — N3946 Mixed incontinence: Secondary | ICD-10-CM | POA: Diagnosis not present

## 2022-02-13 DIAGNOSIS — N3946 Mixed incontinence: Secondary | ICD-10-CM | POA: Diagnosis not present

## 2022-02-13 DIAGNOSIS — N3281 Overactive bladder: Secondary | ICD-10-CM | POA: Diagnosis not present

## 2022-05-23 DIAGNOSIS — N3946 Mixed incontinence: Secondary | ICD-10-CM | POA: Diagnosis not present

## 2022-05-23 DIAGNOSIS — N3281 Overactive bladder: Secondary | ICD-10-CM | POA: Diagnosis not present

## 2022-06-16 IMAGING — CT CT CHEST LUNG CANCER SCREENING LOW DOSE W/O CM
2 of 5 series · 15 of 40 positions shown, 18 images · non-contrast
Comparison: 05/11/2018 screening chest CT.

CLINICAL DATA: 58-year-old asymptomatic female current smoker with
42 pack-year smoking history.

EXAM:
CT CHEST WITHOUT CONTRAST LOW-DOSE FOR LUNG CANCER SCREENING
TECHNIQUE: Multidetector CT imaging of the chest was performed following the
standard protocol without IV contrast.

[Series 3: lung 1.00 · axial · 0.81mm/px · z∈[-1051,-735]mm · 12 of 348 slices shown, 15 images]
[im 16/348  mediastinal]
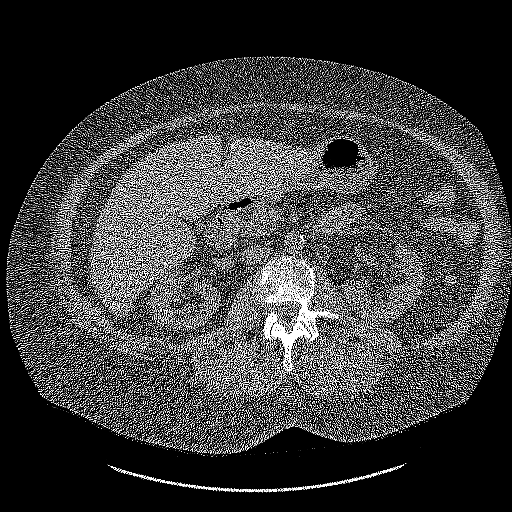
[im 16/348  lung]
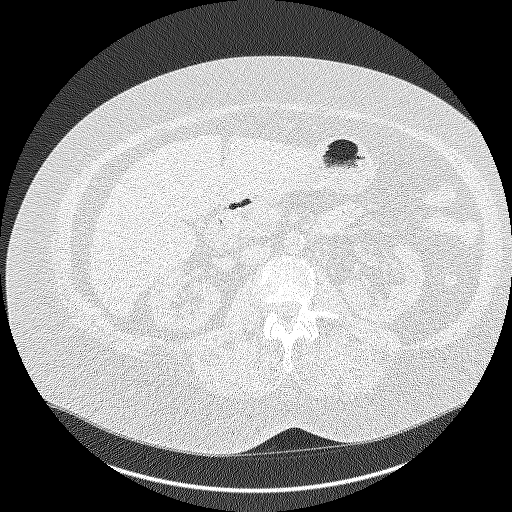
[im 48/348  lung]
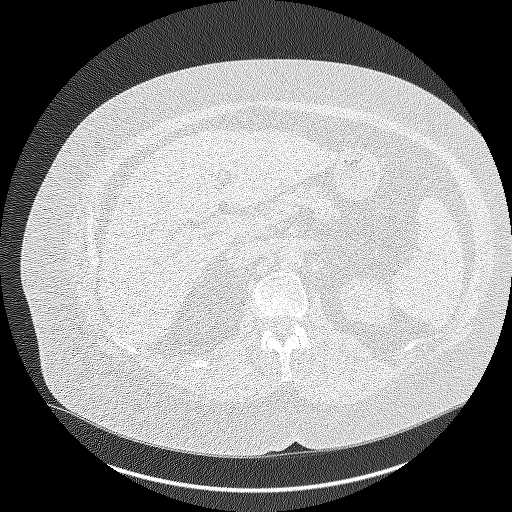
[im 79/348  lung]
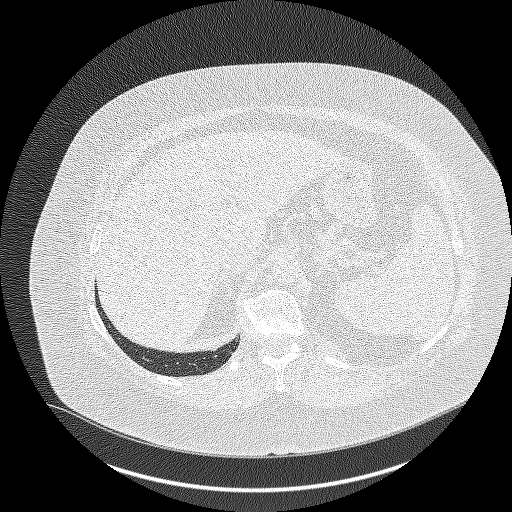
[im 111/348  lung]
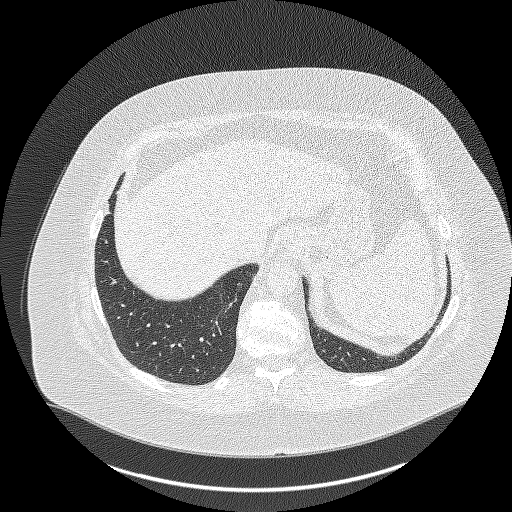
[im 127/348  mediastinal]
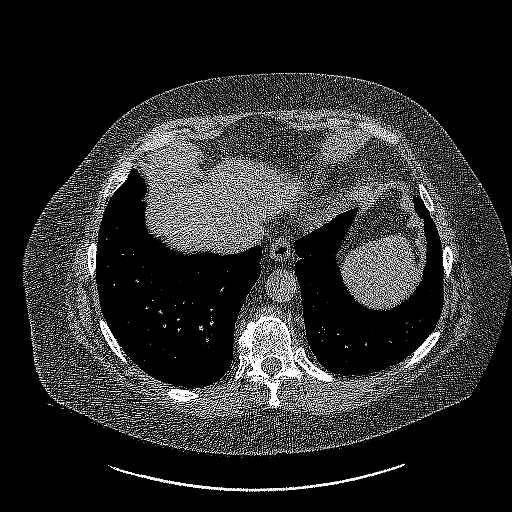
[im 127/348  lung]
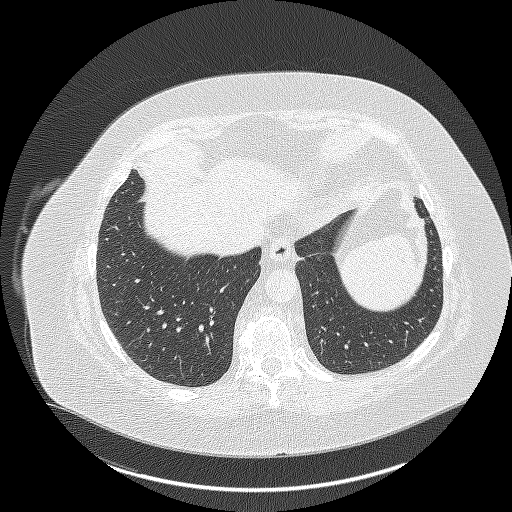
[im 158/348  lung]
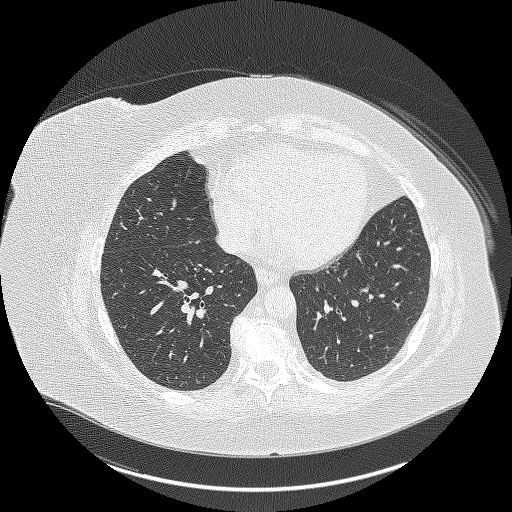
[im 190/348  lung]
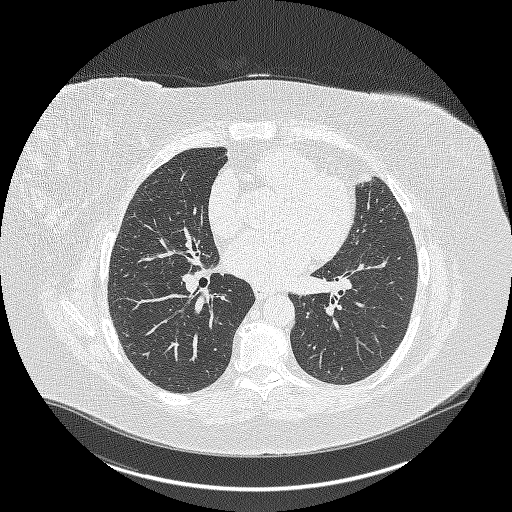
[im 221/348  lung]
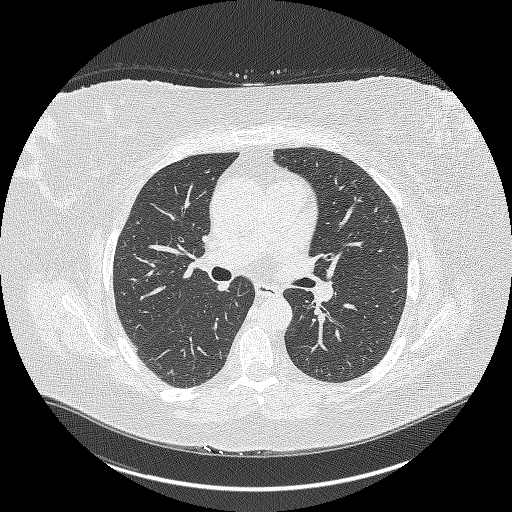
[im 237/348  mediastinal]
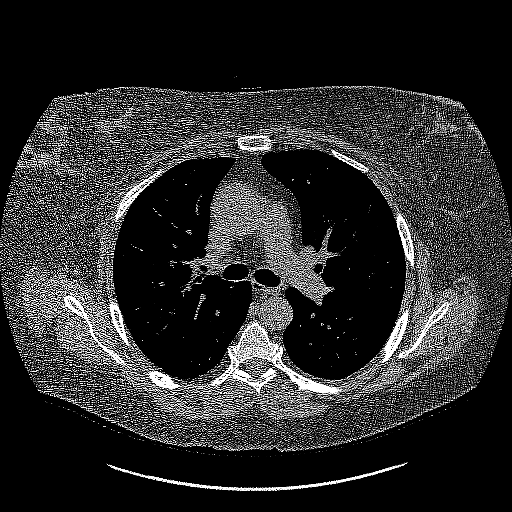
[im 237/348  lung]
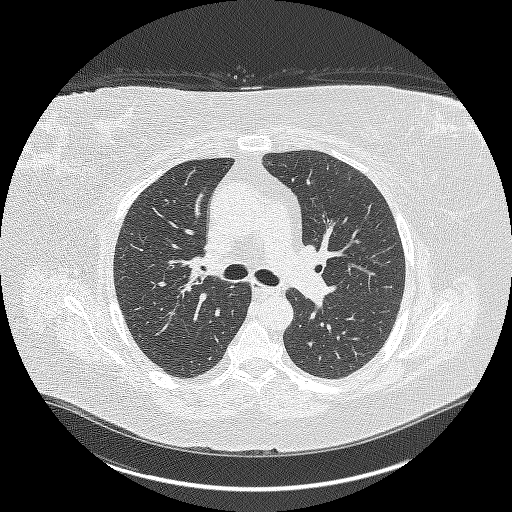
[im 269/348  lung]
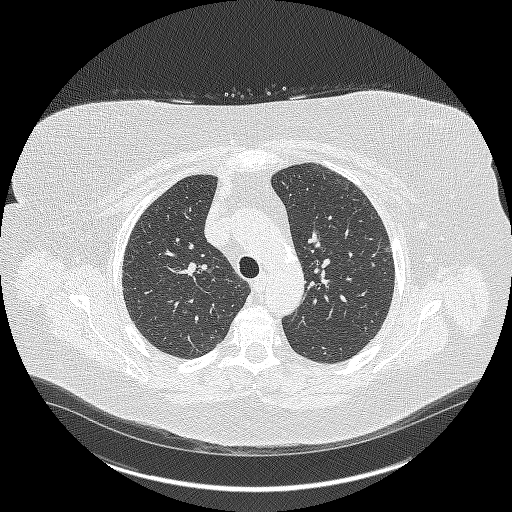
[im 300/348  lung]
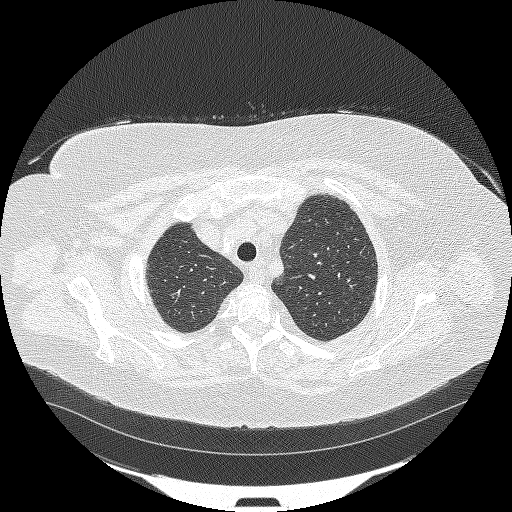
[im 332/348  lung]
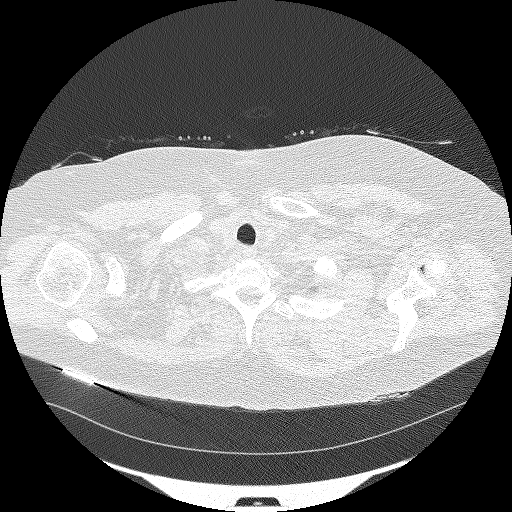

[Series 4: coronals lung 1.00 cor · coronal · 0.68mm/px · 3 of 327 slices shown]
[im 66/327  lung]
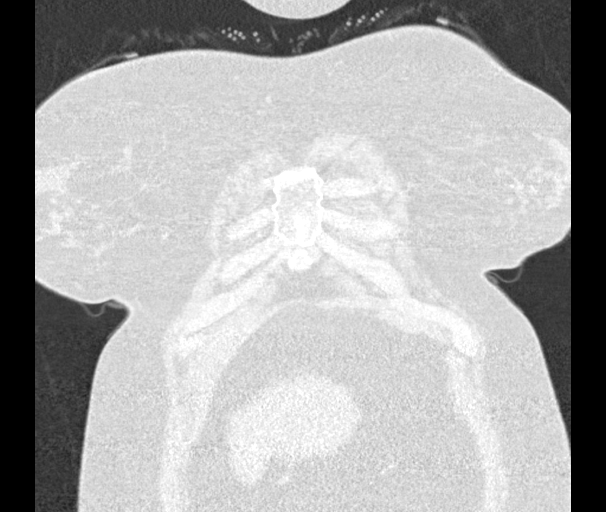
[im 131/327  lung]
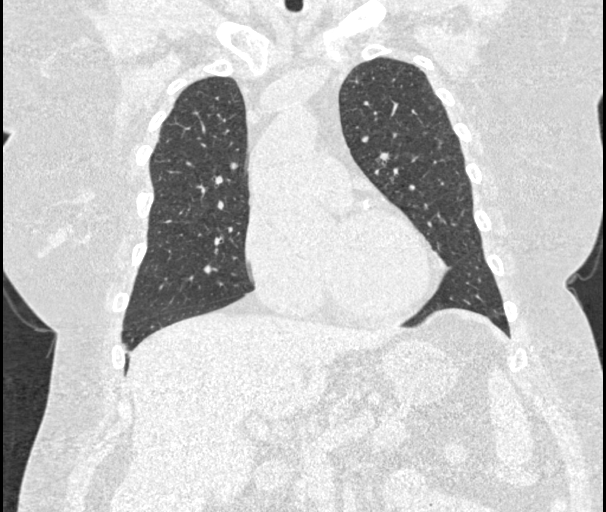
[im 196/327  lung]
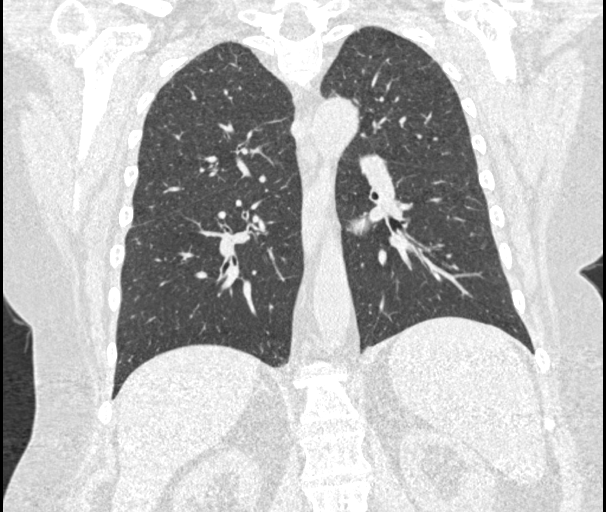

[15 of 40 positions shown; findings below may reference images not displayed]

FINDINGS: Cardiovascular: Normal heart size. No significant pericardial
effusion/thickening. Three-vessel coronary atherosclerosis.
Atherosclerotic thoracic aorta with ectatic 4.2 cm ascending
thoracic aorta, stable. Normal caliber pulmonary arteries.

Mediastinum/Nodes: Thyroid either atrophic or surgically absent.
Unremarkable esophagus. No pathologically enlarged axillary,
mediastinal or hilar lymph nodes, noting limited sensitivity for the
detection of hilar adenopathy on this noncontrast study.

Lungs/Pleura: No pneumothorax. No pleural effusion. Mild
centrilobular emphysema with mild diffuse bronchial wall thickening.
No acute consolidative airspace disease or lung masses. No
significant growth of previously visualized scattered small
pulmonary nodules. No new significant pulmonary nodules.

Upper abdomen: No acute abnormality.

Musculoskeletal: No aggressive appearing focal osseous lesions.
Moderate thoracic spondylosis.
IMPRESSION: 1. Lung-RADS 2, benign appearance or behavior. Continue annual
screening with low-dose chest CT without contrast in 12 months.
2. Three-vessel coronary atherosclerosis.
3. Stable ectatic 4.2 cm ascending thoracic aorta, which can be
reassessed on follow-up screening chest CT in 12 months.
4. Aortic Atherosclerosis (W79VF-V7T.T) and Emphysema (W79VF-0RV.L).

## 2022-07-17 ENCOUNTER — Telehealth: Payer: Self-pay

## 2022-07-17 NOTE — Telephone Encounter (Signed)
Paperwork faxed back to [employer]

## 2022-08-05 ENCOUNTER — Other Ambulatory Visit: Payer: Self-pay | Admitting: Nurse Practitioner

## 2022-08-05 NOTE — Telephone Encounter (Signed)
Requested medication (s) are due for refill today: Yes  Requested medication (s) are on the active medication list: Yes  Last refill:  10/03/21  Future visit scheduled: No  Notes to clinic:  See request.    Requested Prescriptions  Pending Prescriptions Disp Refills   meloxicam (MOBIC) 7.5 MG tablet [Pharmacy Med Name: MELOXICAM 7.5 MG TABLET] 60 tablet 3    Sig: TAKE 1 TABLET (7.5 MG TOTAL) BY MOUTH ONCE A DAY AS NEEDED ONLY     Analgesics:  COX2 Inhibitors Failed - 08/05/2022  9:09 AM      Failed - Manual Review: Labs are only required if the patient has taken medication for more than 8 weeks.      Failed - Cr in normal range and within 360 days    Creatinine, Ser  Date Value Ref Range Status  10/03/2021 1.01 (H) 0.57 - 1.00 mg/dL Final         Passed - HGB in normal range and within 360 days    Hemoglobin  Date Value Ref Range Status  10/03/2021 13.1 11.1 - 15.9 g/dL Final         Passed - HCT in normal range and within 360 days    Hematocrit  Date Value Ref Range Status  10/03/2021 39.1 34.0 - 46.6 % Final         Passed - AST in normal range and within 360 days    AST  Date Value Ref Range Status  10/03/2021 16 0 - 40 IU/L Final         Passed - ALT in normal range and within 360 days    ALT  Date Value Ref Range Status  10/03/2021 14 0 - 32 IU/L Final         Passed - eGFR is 30 or above and within 360 days    GFR calc Af Amer  Date Value Ref Range Status  08/09/2019 62 >59 mL/min/1.73 Final    Comment:    **[facility] currently reports eGFR in compliance with the current**   recommendations of the [employer]. [facility] will   update reporting as new guidelines are published from the NKF-ASN   Task force.    GFR calc non Af Amer  Date Value Ref Range Status  08/09/2019 54 (L) >59 mL/min/1.73 Final   eGFR  Date Value Ref Range Status  10/03/2021 64 >59 mL/min/1.73 Final         Passed - Patient is not pregnant      Passed - Valid  encounter within last 12 months    Recent Outpatient Visits           10 months ago Centrilobular emphysema (HCC)   [place] [facility] [place], Corrie Dandy T, NP   2 years ago Centrilobular emphysema (HCC)   [place] [facility] [place], Corrie Dandy T, NP   2 years ago Chronic bilateral low back pain with right-sided sciatica   [place] [facility] [place], Corrie Dandy T, NP   2 years ago Centrilobular emphysema (HCC)   [place] [facility], Corrie Dandy T, NP   3 years ago HTN (hypertension), benign   [facility] [place], Dorie Rank, NP

## 2022-08-08 DIAGNOSIS — N3281 Overactive bladder: Secondary | ICD-10-CM | POA: Diagnosis not present

## 2022-08-08 DIAGNOSIS — N3946 Mixed incontinence: Secondary | ICD-10-CM | POA: Diagnosis not present

## 2022-08-10 ENCOUNTER — Emergency Department
Admission: EM | Admit: 2022-08-10 | Discharge: 2022-08-10 | Disposition: A | Discharge status: Home / Self Care | Payer: Medicaid Other | Attending: Emergency Medicine | Admitting: Emergency Medicine

## 2022-08-10 ENCOUNTER — Emergency Department: Payer: Medicaid Other

## 2022-08-10 DIAGNOSIS — L03211 Cellulitis of face: Secondary | ICD-10-CM | POA: Diagnosis not present

## 2022-08-10 DIAGNOSIS — R220 Localized swelling, mass and lump, head: Secondary | ICD-10-CM | POA: Diagnosis not present

## 2022-08-10 LAB — COMPREHENSIVE METABOLIC PANEL
ALT: 32 U/L (ref 0–44)
AST: 31 U/L (ref 15–41)
Albumin: 4.3 g/dL (ref 3.5–5.0)
Alkaline Phosphatase: 86 U/L (ref 38–126)
Anion gap: 10 (ref 5–15)
BUN: 22 mg/dL — ABNORMAL HIGH (ref 6–20)
CO2: 23 mmol/L (ref 22–32)
Calcium: 8.9 mg/dL (ref 8.9–10.3)
Chloride: 102 mmol/L (ref 98–111)
Creatinine, Ser: 0.86 mg/dL (ref 0.44–1.00)
GFR, Estimated: >60 mL/min (ref >60)
Glucose, Bld: 106 mg/dL — ABNORMAL HIGH (ref 70–99)
Potassium: 4.6 mmol/L (ref 3.5–5.1)
Sodium: 135 mmol/L (ref 135–145)
Total Bilirubin: 1.3 mg/dL — ABNORMAL HIGH (ref 0.3–1.2)
Total Protein: 7.9 g/dL (ref 6.5–8.1)

## 2022-08-10 LAB — CBC WITH DIFFERENTIAL/PLATELET
Abs Immature Granulocytes: 0.03 10*3/uL (ref 0.00–0.07)
Basophils Absolute: 0 10*3/uL (ref 0.0–0.1)
Basophils Relative: 0 %
Eosinophils Absolute: 0.1 10*3/uL (ref 0.0–0.5)
Eosinophils Relative: 1 %
HCT: 35.7 % — ABNORMAL LOW (ref 36.0–46.0)
Hemoglobin: 11.9 g/dL — ABNORMAL LOW (ref 12.0–15.0)
Immature Granulocytes: 0 %
Lymphocytes Relative: 19 %
Lymphs Abs: 1.9 10*3/uL (ref 0.7–4.0)
MCH: 31.2 pg (ref 26.0–34.0)
MCHC: 33.3 g/dL (ref 30.0–36.0)
MCV: 93.7 fL (ref 80.0–100.0)
Monocytes Absolute: 0.6 10*3/uL (ref 0.1–1.0)
Monocytes Relative: 6 %
Neutro Abs: 7.2 10*3/uL (ref 1.7–7.7)
Neutrophils Relative %: 74 %
Platelets: 120 10*3/uL — ABNORMAL LOW (ref 150–400)
RBC: 3.81 MIL/uL — ABNORMAL LOW (ref 3.87–5.11)
RDW: 14 % (ref 11.5–15.5)
WBC: 9.8 10*3/uL (ref 4.0–10.5)
nRBC: 0 % (ref 0.0–0.2)

## 2022-08-10 MED ORDER — SODIUM CHLORIDE 0.9 % IV SOLN
1.0000 g | Freq: Once | INTRAVENOUS | Status: AC
  Administered 2022-08-10: 1 g via INTRAVENOUS
  Filled 2022-08-10: qty 10

## 2022-08-10 MED ORDER — IOHEXOL 300 MG/ML  SOLN
75.0000 mL | Freq: Once | INTRAMUSCULAR | Status: AC | PRN
  Administered 2022-08-10: 75 mL via INTRAVENOUS

## 2022-08-10 MED ORDER — CEPHALEXIN 500 MG PO CAPS: 500.0000 mg | ORAL_CAPSULE | Freq: Four times a day (QID) | ORAL | 0 refills | Status: AC

## 2022-08-10 MED ORDER — DOXYCYCLINE MONOHYDRATE 100 MG PO TABS: 100.0000 mg | ORAL_TABLET | Freq: Two times a day (BID) | ORAL | 0 refills | Status: AC

## 2022-08-10 NOTE — ED Triage Notes (Signed)
Pt sts that she has been having facial swelling to the front left side of her chin. Pt sts that she thought it was possibly abscessed tooth.

## 2022-08-10 NOTE — ED Provider Notes (Signed)
[facility] Provider Note  Patient Contact: 6:57 PM (approximate)   History   Facial Swelling   HPI  Katrina Curtis is a 62 y.o. female with a history of odontogenic infections, presents to the emergency department with swelling and erythema of the chin which extends to the left submandibular neck.  Patient states that symptoms started about 4 days ago.  She has had no fever or chills.  She denies associated difficulty swallowing.  She can manage her own secretions and speaking in complete sentence.     Physical Exam   Triage Vital Signs: ED Triage Vitals  Enc Vitals Group     BP 08/10/22 1755 (!) 149/86     Pulse Rate 08/10/22 1755 89     Resp 08/10/22 1755 17     Temp 08/10/22 1755 98.1 F (36.7 C)     Temp Source 08/10/22 1755 Oral     SpO2 08/10/22 1755 95 %     Weight 08/10/22 1756 211 lb (95.7 kg)     Height --      Head Circumference --      Peak Flow --      Pain Score 08/10/22 1755 10     Pain Loc --      Pain Edu? --      Excl. in GC? --     Most recent vital signs: Vitals:   08/10/22 1755  BP: (!) 149/86  Pulse: 89  Resp: 17  Temp: 98.1 F (36.7 C)  SpO2: 95%     General: Alert and in no acute distress. Eyes:  PERRL. EOMI. Head: No acute traumatic findings ENT:      Nose: No congestion/rhinnorhea.      Mouth/Throat: Mucous membranes are moist.  No pain underneath the tongue. Neck: No stridor. No cervical spine tenderness to palpation. Cardiovascular:  Good peripheral perfusion Respiratory: Normal respiratory effort without tachypnea or retractions. Lungs CTAB. Good air entry to the bases with no decreased or absent breath sounds. Gastrointestinal: Bowel sounds 4 quadrants. Soft and nontender to palpation. No guarding or rigidity. No palpable masses. No distention. No CVA tenderness. Musculoskeletal: Full range of motion to all extremities.  Neurologic:  No gross focal neurologic deficits are appreciated.  Skin:  Patient has erythema of the chin with extension into the left submandibular neck. Other:   ED Results / Procedures / Treatments   Labs (all labs ordered are listed, but only abnormal results are displayed) Labs Reviewed  CBC WITH DIFFERENTIAL/PLATELET - Abnormal; Notable for the following components:      Result Value   RBC 3.81 (*)    Hemoglobin 11.9 (*)    HCT 35.7 (*)    Platelets 120 (*)    All other components within normal limits  COMPREHENSIVE METABOLIC PANEL - Abnormal; Notable for the following components:   Glucose, Bld 106 (*)    BUN 22 (*)    Total Bilirubin 1.3 (*)    All other components within normal limits         PROCEDURES:  Critical Care performed: No  Procedures   MEDICATIONS ORDERED IN ED: Medications  cefTRIAXone (ROCEPHIN) 1 g in sodium chloride 0.9 % 100 mL IVPB (0 g Intravenous Stopped 08/10/22 2059)  iohexol (OMNIPAQUE) 300 MG/ML solution 75 mL (75 mLs Intravenous Contrast Given 08/10/22 2054)     IMPRESSION / MDM / ASSESSMENT AND PLAN / ED COURSE  I reviewed the triage vital signs and the nursing notes.  Assessment and plan Facial cellulitis 62 year old female presents to the emergency department with erythema and edema of the chin which extends along the left submandibular neck.  Patient was mildly hypertensive at triage but vital signs otherwise reassuring.  On exam, patient was alert and nontoxic-appearing.  She was speaking in complete sentences and managing her own secretions.  CBC and CMP reassuring.  CT soft tissue neck does not indicate a drainable fluid collection.  Patient was given IV Rocephin in the emergency department as she has penicillin allergy but can tolerate cephalosporins.  She was discharged with doxycycline and Keflex.  Return precautions were given to return with new or worsening symptoms.  All patient questions were answered.     FINAL CLINICAL IMPRESSION(S) / ED DIAGNOSES   Final  diagnoses:  Facial cellulitis     Rx / DC Orders   ED Discharge Orders          Ordered    cephALEXin (KEFLEX) 500 MG capsule  4 times daily        08/10/22 2143    doxycycline (ADOXA) 100 MG tablet  2 times daily        08/10/22 2143             Note:  This document was prepared using Dragon voice recognition software and may include unintentional dictation errors.   Pia Mau [place], PA-C 08/10/22 2223    Georga Hacking, MD 08/11/22 [phone]

## 2022-08-10 NOTE — Discharge Instructions (Addendum)
Take Keflex 4 times daily for the next 7 days. Take doxycycline twice daily for the next 7 days. OPTIONS FOR DENTAL FOLLOW UP CARE  [place] [facility] - Local Safety Net Dental Clinics [email or web address]   [facility] [phone])  [facility] [phone])  [place] [phone])  [facility] [phone])  [facility] [phone]) This clinic caters to the indigent population and is on a lottery system. Location: [employer] of Dentistry, [employer], [address], [place] Clinic Hours: Wednesdays from 6pm - 9pm, patients seen by a lottery system. For dates, call or go to [email or web address] Services: Cleanings, fillings and simple extractions. Payment Options: DENTAL WORK IS FREE OF CHARGE. Bring proof of income or support. Best way to get seen: Arrive at 5:15 pm - this is a lottery, NOT first come/first serve, so arriving earlier will not increase your chances of being seen.     [facility] [phone] Select option 1 for emergencies   Location: [facility] of Dentistry, [place], [address] [place] Clinic Hours: No walk-ins accepted - call the day before to schedule an appointment. Check in times are 9:30 am and 1:30 pm. Services: Simple extractions, temporary fillings, pulpectomy/pulp debridement, uncomplicated abscess drainage. Payment Options: PAYMENT IS DUE AT THE TIME OF SERVICE.  Fee is usually $100-200, additional surgical procedures (e.g. abscess drainage) may be extra. Cash, checks, Visa/MasterCard accepted.  Can file Medicaid if patient is covered for dental - patient should call case worker to check. No discount for [facility] patients. Best way to get seen: MUST call the day before and get onto the schedule. Can usually be seen the  next 1-2 days. No walk-ins accepted.     [facility] [phone]   Location: [facility], [address], [place] Clinic Hours: M, W, Th, F 8am or 1:30pm, Tues 9a or 1:30 - first come/first served. Services: Simple extractions, temporary fillings, uncomplicated abscess drainage.  You do not need to be an [facility] resident. Payment Options: PAYMENT IS DUE AT THE TIME OF SERVICE. Dental insurance, otherwise sliding scale - bring proof of income or support. Depending on income and treatment needed, cost is usually $50-200. Best way to get seen: Arrive early as it is first come/first served.     [facility] [phone]   Location: [address] Clinic Hours: Mon-Thu 8a-5p Services: Most basic dental services including extractions and fillings. Payment Options: PAYMENT IS DUE AT THE TIME OF SERVICE. Sliding scale, up to 50% off - bring proof if income or support. Medicaid with dental option accepted. Best way to get seen: Call to schedule an appointment, can usually be seen within 2 weeks OR they will try to see walk-ins - show up at 8a or 2p (you may have to wait).     [facility] [phone] [place] RESIDENTS ONLY   Location: [facility], [address], [place], [place] [address] Clinic Hours: By appointment only. Monday - Thursday 8am-5pm, Friday 8am-12pm Services: Cleanings, fillings, extractions. Payment Options: PAYMENT IS DUE AT THE TIME OF SERVICE. Cash, Visa or MasterCard. Sliding scale - $30 minimum per service. Best way to get seen: Come in to office, complete packet and make an appointment - need proof of income or support monies for each household member and proof of [facility] residence. Usually takes about a month  to get in.     [facility] [phone]   Location: [address],  [facility] Clinic Hours: Walk-in Urgent Care Dental Services are offered Monday-Friday mornings only. The numbers of emergencies accepted daily is limited to the number of providers available. Maximum 15 - Mondays, Wednesdays & Thursdays Maximum 10 - Tuesdays & Fridays Services: You do not need to be a [facility] resident to be seen for a dental emergency. Emergencies are defined as pain, swelling, abnormal bleeding, or dental trauma. Walkins will receive x-rays if needed. NOTE: Dental cleaning is not an emergency. Payment Options: PAYMENT IS DUE AT THE TIME OF SERVICE. Minimum co-pay is $40.00 for uninsured patients. Minimum co-pay is $3.00 for Medicaid with dental coverage. Dental Insurance is accepted and must be presented at time of visit. Medicare does not cover dental. Forms of payment: Cash, credit card, checks. Best way to get seen: If not previously registered with the clinic, walk-in dental registration begins at 7:15 am and is on a first come/first serve basis. If previously registered with the clinic, call to make an appointment.     [facility] [phone] [place] RESIDENTS ONLY   Location: [address], [place], [place] Clinic Hours: Mon-Thu 10a-2p Services: Extractions only! Payment Options: FREE (donations accepted) - bring proof of income or support Best way to get seen: Call and schedule an appointment OR come at 8am on the 1st Monday of every month (except for holidays) when it is first come/first served.     [facility] [phone]   Location: [address] [place], [place] Clinic Hours: Friday mornings Services, Payment Options, Best way to get seen: Call for info

## 2022-08-10 NOTE — ED Notes (Signed)
Pt up to the restroom with a steady gait. 

## 2022-08-10 NOTE — ED Provider Triage Note (Signed)
Emergency Medicine Provider Triage Evaluation Note  Katrina Curtis , a 62 y.o. female  was evaluated in triage.  Pt complains of dental pain and facial swelling for the past 2 days. No fever.  Physical Exam  BP (!) 149/86 (BP Location: Left Arm)   Pulse 89   Temp 98.1 F (36.7 C) (Oral)   Resp 17   Wt 95.7 kg   SpO2 95%   BMI 31.16 kg/m  Gen:   Awake, no distress   Resp:  Normal effort  MSK:   Moves extremities without difficulty  Other:    Medical Decision Making  Medically screening exam initiated at 5:57 PM.  Appropriate orders placed.  Katrina Curtis was informed that the remainder of the evaluation will be completed by another provider, this initial triage assessment does not replace that evaluation, and the importance of remaining in the ED until their evaluation is complete.    Chinita Pester, FNP 08/10/22 1758

## 2022-10-31 DIAGNOSIS — N3946 Mixed incontinence: Secondary | ICD-10-CM | POA: Diagnosis not present

## 2022-10-31 DIAGNOSIS — N3281 Overactive bladder: Secondary | ICD-10-CM | POA: Diagnosis not present

## 2022-11-26 ENCOUNTER — Other Ambulatory Visit: Payer: Self-pay | Admitting: Nurse Practitioner

## 2022-11-27 NOTE — Telephone Encounter (Signed)
Has appointment. Requested Prescriptions  Pending Prescriptions Disp Refills   atorvastatin (LIPITOR) 40 MG tablet [Pharmacy Med Name: ATORVASTATIN 40 MG TABLET] 90 tablet 0    Sig: TAKE 1 TABLET BY MOUTH EVERY DAY     Cardiovascular:  Antilipid - Statins Failed - 11/26/2022  9:12 AM      Failed - Valid encounter within last 12 months    Recent Outpatient Visits           1 year ago Centrilobular emphysema (HCC)   [place] [facility] [place], [place] T, NP   2 years ago Centrilobular emphysema (HCC)   [place] [facility] [place], Corrie Dandy T, NP   3 years ago Chronic bilateral low back pain with right-sided sciatica   [place] [facility] [place], Corrie Dandy T, NP   3 years ago Centrilobular emphysema (HCC)   [place] [facility] [place], Corrie Dandy T, NP   3 years ago HTN (hypertension), benign   [place] [facility] [place], Corrie Dandy T, NP       Future Appointments             In 3 weeks Cannady, Dorie Rank, NP [facility]            Failed - Lipid Panel in normal range within the last 12 months    Cholesterol, Total  Date Value Ref Range Status  10/03/2021 181 100 - 199 mg/dL Final   LDL Chol Calc (NIH)  Date Value Ref Range Status  10/03/2021 95 0 - 99 mg/dL Final   HDL  Date Value Ref Range Status  10/03/2021 59 >39 mg/dL Final   Triglycerides  Date Value Ref Range Status  10/03/2021 157 (H) 0 - 149 mg/dL Final         Passed - Patient is not pregnant       albuterol (VENTOLIN HFA) 108 (90 Base) MCG/ACT inhaler [Pharmacy Med Name: VENTOLIN HFA 90 MCG INHALER] 18 each 0    Sig: INHALE 1-2 PUFFS BY MOUTH EVERY 6 HOURS AS NEEDED FOR WHEEZE OR SHORTNESS OF BREATH     Pulmonology:  Beta Agonists 2 Failed - 11/26/2022  9:12 AM      Failed - Last BP in normal range    BP Readings from Last 1 Encounters:  08/10/22 (!) 149/86         Failed - Valid encounter  within last 12 months    Recent Outpatient Visits           1 year ago Centrilobular emphysema (HCC)   [place] [facility] [place], Corrie Dandy T, NP   2 years ago Centrilobular emphysema (HCC)   [place] [facility] [place], [place] T, NP   3 years ago Chronic bilateral low back pain with right-sided sciatica   [place] [facility] [place], Corrie Dandy T, NP   3 years ago Centrilobular emphysema (HCC)   [facility] [place], Corrie Dandy T, NP   3 years ago HTN (hypertension), benign   [place] [facility] [place], Dorie Rank, NP       Future Appointments             In 3 weeks Marjie Skiff, NP [place] [facility], [facility]            Passed - Last Heart Rate in normal range    Pulse Readings from Last 1 Encounters:  08/10/22  89          umeclidinium-vilanterol (ANORO ELLIPTA) 62.5-25 MCG/ACT AEPB [Pharmacy Med Name: ANORO ELLIPTA 62.5-25 MCG INH] 60 each 0    Sig: INHALE 1 PUFF INTO THE LUNGS DAILY AT 6 (SIX) AM.     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-sided sciatica   [place] [facility] [place], Corrie Dandy T, NP   3 years ago Centrilobular emphysema (HCC)   [place] [facility] [place], Corrie Dandy T, NP   3 years ago HTN (hypertension), benign   [place] [facility] [place], Dorie Rank, NP       Future Appointments             In 3 weeks Cannady, Dorie Rank, NP [place] [facility], [facility]             sertraline (ZOLOFT) 100 MG tablet  [Pharmacy Med Name: SERTRALINE HCL 100 MG TABLET] 180 tablet 0    Sig: TAKE 2 TABLETS BY MOUTH EVERY DAY     Psychiatry:  Antidepressants - SSRI - sertraline Failed - 11/26/2022  9:12 AM      Failed - Completed PHQ-2 or PHQ-9 in the last 360 days      Failed - Valid encounter within last 6 months    Recent Outpatient Visits           1 year ago Centrilobular emphysema (HCC)   [place] [facility] [place], [place] T, NP   2 years ago Centrilobular emphysema (HCC)   [place] [facility] [place], [place] T, NP   3 years ago Chronic bilateral low back pain with right-sided sciatica   [place] [facility] [place], Corrie Dandy T, NP   3 years ago Centrilobular emphysema (HCC)   [place] [facility] [place], Corrie Dandy T, NP   3 years ago HTN (hypertension), benign    [place], [place] T, NP       Future Appointments             In 3 weeks Cannady, [place] T, NP  [facility], [facility]            Passed - AST in normal range and within 360 days    AST  Date Value Ref Range Status  08/10/2022 31 15 - 41 U/L Final         Passed - ALT in normal range and within 360 days    ALT  Date Value Ref Range Status  08/10/2022 32 0 - 44 U/L Final

## 2022-11-28 ENCOUNTER — Other Ambulatory Visit: Payer: Self-pay

## 2022-11-28 MED ORDER — LISINOPRIL 20 MG PO TABS: 20.0000 mg | ORAL_TABLET | Freq: Every day | ORAL | 4 refills | Status: DC

## 2022-11-28 MED ORDER — LEVOTHYROXINE SODIUM 175 MCG PO TABS: 175.0000 ug | ORAL_TABLET | Freq: Every day | ORAL | 4 refills | Status: DC

## 2022-12-23 ENCOUNTER — Ambulatory Visit (INDEPENDENT_AMBULATORY_CARE_PROVIDER_SITE_OTHER): Payer: Medicaid Other | Admitting: Nurse Practitioner

## 2022-12-23 ENCOUNTER — Encounter: Payer: Self-pay | Admitting: Nurse Practitioner

## 2022-12-23 VITALS — BP 123/61 | HR 83 | Temp 98.2°F | Ht 67.0 in | Wt 212.8 lb

## 2022-12-23 DIAGNOSIS — M542 Cervicalgia: Secondary | ICD-10-CM | POA: Diagnosis not present

## 2022-12-23 DIAGNOSIS — E66811 Obesity, class 1: Secondary | ICD-10-CM

## 2022-12-23 DIAGNOSIS — I77810 Thoracic aortic ectasia: Secondary | ICD-10-CM

## 2022-12-23 DIAGNOSIS — E6609 Other obesity due to excess calories: Secondary | ICD-10-CM

## 2022-12-23 DIAGNOSIS — E039 Hypothyroidism, unspecified: Secondary | ICD-10-CM | POA: Diagnosis not present

## 2022-12-23 DIAGNOSIS — N3281 Overactive bladder: Secondary | ICD-10-CM | POA: Diagnosis not present

## 2022-12-23 DIAGNOSIS — N1831 Chronic kidney disease, stage 3a: Secondary | ICD-10-CM

## 2022-12-23 DIAGNOSIS — F339 Major depressive disorder, recurrent, unspecified: Secondary | ICD-10-CM

## 2022-12-23 DIAGNOSIS — I700 Atherosclerosis of aorta: Secondary | ICD-10-CM

## 2022-12-23 DIAGNOSIS — J432 Centrilobular emphysema: Secondary | ICD-10-CM | POA: Diagnosis not present

## 2022-12-23 DIAGNOSIS — Z0000 Encounter for general adult medical examination without abnormal findings: Secondary | ICD-10-CM

## 2022-12-23 DIAGNOSIS — Z124 Encounter for screening for malignant neoplasm of cervix: Secondary | ICD-10-CM

## 2022-12-23 DIAGNOSIS — Z1231 Encounter for screening mammogram for malignant neoplasm of breast: Secondary | ICD-10-CM

## 2022-12-23 DIAGNOSIS — E782 Mixed hyperlipidemia: Secondary | ICD-10-CM

## 2022-12-23 DIAGNOSIS — F17210 Nicotine dependence, cigarettes, uncomplicated: Secondary | ICD-10-CM

## 2022-12-23 DIAGNOSIS — I10 Essential (primary) hypertension: Secondary | ICD-10-CM | POA: Diagnosis not present

## 2022-12-23 DIAGNOSIS — Z23 Encounter for immunization: Secondary | ICD-10-CM

## 2022-12-23 MED ORDER — MELOXICAM 7.5 MG PO TABS: ORAL_TABLET | ORAL | 3 refills | Status: DC

## 2022-12-23 MED ORDER — LISINOPRIL 20 MG PO TABS: 20.0000 mg | ORAL_TABLET | Freq: Every day | ORAL | 4 refills | Status: DC

## 2022-12-23 MED ORDER — GABAPENTIN 600 MG PO TABS: 600.0000 mg | ORAL_TABLET | Freq: Every day | ORAL | 4 refills | Status: DC

## 2022-12-23 MED ORDER — ANORO ELLIPTA 62.5-25 MCG/ACT IN AEPB: 1.0000 | INHALATION_SPRAY | Freq: Every day | RESPIRATORY_TRACT | 4 refills | Status: DC

## 2022-12-23 MED ORDER — SERTRALINE HCL 100 MG PO TABS: 200.0000 mg | ORAL_TABLET | Freq: Every day | ORAL | 4 refills | Status: DC

## 2022-12-23 MED ORDER — VENTOLIN HFA 108 (90 BASE) MCG/ACT IN AERS: 1.0000 | INHALATION_SPRAY | Freq: Four times a day (QID) | RESPIRATORY_TRACT | 4 refills | Status: DC | PRN

## 2022-12-23 MED ORDER — ATORVASTATIN CALCIUM 40 MG PO TABS: 40.0000 mg | ORAL_TABLET | Freq: Every day | ORAL | 0 refills | Status: DC

## 2022-12-23 MED ORDER — LEVOTHYROXINE SODIUM 175 MCG PO TABS: 175.0000 ug | ORAL_TABLET | Freq: Every day | ORAL | 4 refills | Status: DC

## 2022-12-23 NOTE — Assessment & Plan Note (Signed)
Chronic, ongoing with numbness to both arms and some weakness.  Will increase Gabapentin to 600 MG as most discomfort is at night.  Continue Tylenol as needed, try to avoid Ibuprofen.  Referral to ortho placed for further recommendations.

## 2022-12-23 NOTE — Assessment & Plan Note (Signed)
Chronic, stable.  Continue Lisinopril for kidney protection, but consider change to ARB due to underlying lung disease.  CMP today.  If worsening or decline in function consider referral to nephrology.

## 2022-12-23 NOTE — Assessment & Plan Note (Signed)
BMI 33.33.  Recommended eating smaller high protein, low fat meals more frequently and exercising 30 mins a day 5 times a week with a goal of 10-15lb weight loss in the next 3 months. Patient voiced their understanding and motivation to adhere to these recommendations.

## 2022-12-23 NOTE — Assessment & Plan Note (Addendum)
Chronic, ongoing.  No recent exacerbations.  She is doing better with Anoro daily.  Continue this and Albuterol as needed.  Discussed at length with patient. Spirometry next visit.  Recommend she schedule her yearly CT lung screening (new referral placed as has missed 3 years) + recommend complete cessation of smoking.

## 2022-12-23 NOTE — Assessment & Plan Note (Signed)
Chronic, stable.  Denies SI/HI.  Continue current medication regimen and adjust as needed.  Monitor NA+ level, CMP today.

## 2022-12-23 NOTE — Assessment & Plan Note (Signed)
Chronic, ongoing with no improvement.  Continues to wear briefs.  No benefit from oral medications used.  Recommend complete cessation of smoking and educated on diet changes to assist with symptoms.  She was to return to urology, but has not.  OAB is effecting her quality of life, does not like leaving home.  Discussed at length with her the need to return to urology and to discuss PTNS with them.  Highly recommend she return.

## 2022-12-23 NOTE — Assessment & Plan Note (Signed)
Chronic, stable.  BP at goal in office today.  Will continue Lisinopril 20 MG daily at this time for kidney protection, consider reduction if lower readings and symptomatic -- will also consider change to ARB as is a smoker with lung disease present.  Recommend complete cessation smoking.  Recommend she monitor BP at least a few mornings a week at home and document.  DASH diet at home.  Labs today: CMP, TSH, CBC, urine ALB.

## 2022-12-23 NOTE — Assessment & Plan Note (Signed)
Chronic, ongoing.  Continue current medication regimen and adjust as needed.  Lipid panel today.  Recommend complete cessation of smoking for prevention.

## 2022-12-23 NOTE — Patient Instructions (Signed)

## 2022-12-23 NOTE — Assessment & Plan Note (Signed)
Chronic, ongoing.  Continue current medication regimen and adjust as needed.  Thyroid labs today.

## 2022-12-23 NOTE — Assessment & Plan Note (Signed)
Noted on lung screening in 2021, remained stable with recommendation to repeat in 12 months.  She missed this in 2022 and 2023.  Discussed with her at length today importance of screening and recheck of this, discussed risks with her.  New referral to lung screening team placed.
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
## 2022-12-23 NOTE — Assessment & Plan Note (Signed)
Noted on CT lung 2021.  Recommend continued statin use daily + recommend complete cessation of smoking.  Discussed benefit of adding on daily Baby ASA for prevention, recommend adding this on.

## 2022-12-23 NOTE — Assessment & Plan Note (Signed)
I have recommended complete cessation of tobacco use. I have discussed various options available for assistance with tobacco cessation including over the counter methods (Nicotine gum, patch and lozenges). We also discussed prescription options (Chantix, Nicotine Inhaler / Nasal Spray). The patient is not interested in pursuing any prescription tobacco cessation options at this time.  Recommend she return for lung cancer screening.

## 2022-12-24 ENCOUNTER — Other Ambulatory Visit: Payer: Self-pay | Admitting: Nurse Practitioner

## 2022-12-24 DIAGNOSIS — R7989 Other specified abnormal findings of blood chemistry: Secondary | ICD-10-CM

## 2022-12-24 DIAGNOSIS — E039 Hypothyroidism, unspecified: Secondary | ICD-10-CM

## 2022-12-24 LAB — COMPREHENSIVE METABOLIC PANEL
ALT: 51 IU/L — ABNORMAL HIGH (ref 0–32)
AST: 41 IU/L — ABNORMAL HIGH (ref 0–40)
Albumin: 4.6 g/dL (ref 3.8–4.9)
Alkaline Phosphatase: 98 IU/L (ref 44–121)
BUN/Creatinine Ratio: 27 (ref 12–28)
BUN: 25 mg/dL (ref 8–27)
Bilirubin Total: 0.7 mg/dL (ref 0.0–1.2)
CO2: 20 mmol/L (ref 20–29)
Calcium: 9.2 mg/dL (ref 8.7–10.3)
Chloride: 103 mmol/L (ref 96–106)
Creatinine, Ser: 0.93 mg/dL (ref 0.57–1.00)
Globulin, Total: 2.7 g/dL (ref 1.5–4.5)
Glucose: 103 mg/dL — ABNORMAL HIGH (ref 70–99)
Potassium: 4.4 mmol/L (ref 3.5–5.2)
Sodium: 138 mmol/L (ref 134–144)
Total Protein: 7.3 g/dL (ref 6.0–8.5)
eGFR: 70 mL/min/{1.73_m2} (ref >59)

## 2022-12-24 LAB — LIPID PANEL W/O CHOL/HDL RATIO
Cholesterol, Total: 167 mg/dL (ref 100–199)
HDL: 54 mg/dL (ref >39)
LDL Chol Calc (NIH): 75 mg/dL (ref 0–99)
Triglycerides: 235 mg/dL — ABNORMAL HIGH (ref 0–149)
VLDL Cholesterol Cal: 38 mg/dL (ref 5–40)

## 2022-12-24 LAB — CBC WITH DIFFERENTIAL/PLATELET
Basophils Absolute: 0.1 10*3/uL (ref 0.0–0.2)
Basos: 1 %
EOS (ABSOLUTE): 0.1 10*3/uL (ref 0.0–0.4)
Eos: 2 %
Hematocrit: 37.3 % (ref 34.0–46.6)
Hemoglobin: 12.1 g/dL (ref 11.1–15.9)
Immature Grans (Abs): 0 10*3/uL (ref 0.0–0.1)
Immature Granulocytes: 0 %
Lymphocytes Absolute: 2.8 10*3/uL (ref 0.7–3.1)
Lymphs: 37 %
MCH: 30.6 pg (ref 26.6–33.0)
MCHC: 32.4 g/dL (ref 31.5–35.7)
MCV: 94 fL (ref 79–97)
Monocytes Absolute: 0.4 10*3/uL (ref 0.1–0.9)
Monocytes: 6 %
Neutrophils Absolute: 4.1 10*3/uL (ref 1.4–7.0)
Neutrophils: 54 %
Platelets: 163 10*3/uL (ref 150–450)
RBC: 3.95 x10E6/uL (ref 3.77–5.28)
RDW: 13.5 % (ref 11.7–15.4)
WBC: 7.6 10*3/uL (ref 3.4–10.8)

## 2022-12-24 LAB — MICROALBUMIN, URINE WAIVED
Creatinine, Urine Waived: 100 mg/dL (ref 10–300)
Microalb, Ur Waived: 30 mg/L — ABNORMAL HIGH (ref 0–19)
Microalb/Creat Ratio: <30 mg/g (ref <30)

## 2022-12-24 LAB — T4, FREE: Free T4: 1.47 ng/dL (ref 0.82–1.77)

## 2022-12-24 LAB — TSH: TSH: 0.195 u[IU]/mL — ABNORMAL LOW (ref 0.450–4.500)

## 2022-12-24 MED ORDER — LEVOTHYROXINE SODIUM 150 MCG PO TABS: 150.0000 ug | ORAL_TABLET | Freq: Every day | ORAL | 0 refills | Status: DC

## 2022-12-24 NOTE — Progress Notes (Signed)
Good afternoon Brit and team -- please let patient know results and needs 6 week lab visit only: - Kidney function, creatinine and eGFR, remains stable.  Liver function is a little elevated, please cut back on any Tylenol or alcohol use if present.   - Cholesterol levels overall stable, continue Atorvastatin, we may increase dose or change at a future visit. - Thyroid levels are too hyper (overactive).  I am going to adjust your Levothyroxine dose by lowering it to 150 MCG, please start 150 MCG dose and stop 175 MCG dose.  We will recheck labs outpatient in 6 weeks, I will recheck liver then too. - Remainder of labs stable.  Any questions? Keep being amazing!!  Thank you for allowing me to participate in your care.  I appreciate you. Kindest regards, Aprel Egelhoff

## 2023-01-07 ENCOUNTER — Other Ambulatory Visit: Payer: Self-pay | Admitting: Nurse Practitioner

## 2023-01-07 DIAGNOSIS — N3281 Overactive bladder: Secondary | ICD-10-CM | POA: Diagnosis not present

## 2023-01-07 DIAGNOSIS — N3946 Mixed incontinence: Secondary | ICD-10-CM | POA: Diagnosis not present

## 2023-01-07 MED ORDER — LISINOPRIL 20 MG PO TABS: 20.0000 mg | ORAL_TABLET | Freq: Every day | ORAL | 4 refills | Status: DC

## 2023-01-07 MED ORDER — SERTRALINE HCL 100 MG PO TABS: 200.0000 mg | ORAL_TABLET | Freq: Every day | ORAL | 4 refills | Status: DC

## 2023-01-07 MED ORDER — ATORVASTATIN CALCIUM 40 MG PO TABS: 40.0000 mg | ORAL_TABLET | Freq: Every day | ORAL | 0 refills | Status: DC

## 2023-01-07 MED ORDER — ANORO ELLIPTA 62.5-25 MCG/ACT IN AEPB: 1.0000 | INHALATION_SPRAY | Freq: Every day | RESPIRATORY_TRACT | 4 refills | Status: DC

## 2023-01-07 MED ORDER — LEVOTHYROXINE SODIUM 150 MCG PO TABS: 150.0000 ug | ORAL_TABLET | Freq: Every day | ORAL | 0 refills | Status: DC

## 2023-01-07 MED ORDER — ALBUTEROL SULFATE HFA 108 (90 BASE) MCG/ACT IN AERS: 1.0000 | INHALATION_SPRAY | Freq: Four times a day (QID) | RESPIRATORY_TRACT | 4 refills | Status: DC | PRN

## 2023-01-07 MED ORDER — MELOXICAM 7.5 MG PO TABS: ORAL_TABLET | ORAL | 3 refills | Status: DC

## 2023-01-07 MED ORDER — GABAPENTIN 600 MG PO TABS: 600.0000 mg | ORAL_TABLET | Freq: Every day | ORAL | 4 refills | Status: DC

## 2023-01-07 NOTE — Telephone Encounter (Signed)
Medication Refill - Medication: lisinopril (ZESTRIL) 20 MG tablet ,  levothyroxine (SYNTHROID) 150 MCG tablet  gabapentin (NEURONTIN) 600 MG tablet, meloxicam (MOBIC) 7.5 MG tablet ,sertraline (ZOLOFT) 100 MG tablet ,albuterol (VENTOLIN HFA) 108 (90 Base) MCG/ACT inhaler , atorvastatin (LIPITOR) 40 MG tablet ,umeclidinium-vilanterol (ANORO ELLIPTA) 62.5-25 MCG/ACT AEPB   Has the patient contacted their pharmacy? Yes.   Pt states [facility] [place] [place].  She does not know why these were sent there. Can you please have these transferred to the [facility] listed below?  Preferred Pharmacy (with phone number or street name): [id number] [place], [place] - [address]  Has the patient been seen for an appointment in the last year OR does the patient have an upcoming appointment? Yes.    Agent: Please be advised that RX refills may take up to 3 business days. We ask that you follow-up with your pharmacy.

## 2023-01-07 NOTE — Telephone Encounter (Signed)
Resending as provider ordered initially on 9/24 and 12/24/22.   Requested Prescriptions  Pending Prescriptions Disp Refills   atorvastatin (LIPITOR) 40 MG tablet 90 tablet 0    Sig: Take 1 tablet (40 mg total) by mouth daily.     Cardiovascular:  Antilipid - Statins Failed - 01/07/2023 11:55 AM      Failed - Lipid Panel in normal range within the last 12 months    Cholesterol, Total  Date Value Ref Range Status  12/23/2022 167 100 - 199 mg/dL Final   LDL Chol Calc (NIH)  Date Value Ref Range Status  12/23/2022 75 0 - 99 mg/dL Final   HDL  Date Value Ref Range Status  12/23/2022 54 >39 mg/dL Final   Triglycerides  Date Value Ref Range Status  12/23/2022 235 (H) 0 - 149 mg/dL Final         Passed - Patient is not pregnant      Passed - Valid encounter within last 12 months    Recent Outpatient Visits           2 weeks ago Centrilobular emphysema (HCC)   [place] [facility] [place], [place] T, NP   1 year ago Centrilobular emphysema (HCC)   [place] [facility] [place], Corrie Dandy T, NP   2 years ago Centrilobular emphysema (HCC)   [facility] [place], [place]neiska T, NP   3 years ago Chronic bilateral low back pain with right-sided sciatica   [place] [facility] [place], Corrie Dandy T, NP   3 years ago Centrilobular emphysema (HCC)   [place] [facility] [place], [place] T, NP       Future Appointments             In 5 months Cannady, [place] T, NP [place] [facility]             levothyroxine (SYNTHROID) 150 MCG tablet 90 tablet 0    Sig: Take 1 tablet (150 mcg total) by mouth daily.     Endocrinology:  Hypothyroid Agents Failed - 01/07/2023 11:55 AM      Failed - TSH in normal range and within 360 days    TSH  Date Value Ref Range Status  12/23/2022 0.195 (L) 0.450 - 4.500 uIU/mL Final         Passed - Valid encounter within last 12 months    Recent  Outpatient Visits           2 weeks ago Centrilobular emphysema (HCC)   [place] [facility] [place], Corrie Dandy T, NP   1 year ago Centrilobular emphysema (HCC)   [place] [facility] [place], Corrie Dandy T, NP   2 years ago Centrilobular emphysema (HCC)   [place] [facility] [place], [place] T, NP   3 years ago Chronic bilateral low back pain with right-sided sciatica   [place] [facility] [place], Corrie Dandy T, NP   3 years ago Centrilobular emphysema (HCC)   [place] [facility] [place], Corrie Dandy T, NP       Future Appointments             In 5 months Cannady, Jackson T, NP  [facility]             lisinopril (ZESTRIL) 20 MG tablet 90 tablet 4    Sig: Take 1 tablet (20 mg total) by mouth daily.     Cardiovascular:  ACE Inhibitors Passed - 01/07/2023 11:55 AM      Passed - Cr in normal range and within 180 days    Creatinine, Ser  Date Value Ref Range Status  12/23/2022 0.93 0.57 - 1.00 mg/dL Final         Passed - K in normal range and within 180 days    Potassium  Date Value Ref Range Status  12/23/2022 4.4 3.5 - 5.2 mmol/L Final         Passed - Patient is not pregnant      Passed - Last BP in normal range    BP Readings from Last 1 Encounters:  12/23/22 123/61         Passed - Valid encounter within last 6 months    Recent Outpatient Visits           2 weeks ago Centrilobular emphysema (HCC)   [place] [facility] [place], Corrie Dandy T, NP   1 year ago Centrilobular emphysema (HCC)   [place] [facility] [place], Corrie Dandy T, NP   2 years ago Centrilobular emphysema (HCC)   [place] [facility] [place], [place] T, NP   3 years ago Chronic bilateral low back pain with right-sided sciatica   [place] [facility] [place], Corrie Dandy T, NP   3 years ago Centrilobular emphysema (HCC)   [place] [facility] [place], [place] T, NP       Future Appointments             In 5 months Cannady, [place] T, NP [place] [facility]             gabapentin (NEURONTIN) 600 MG tablet 90 tablet 4    Sig: Take 1 tablet (600 mg total) by mouth at bedtime.     Neurology: Anticonvulsants - gabapentin Passed - 01/07/2023 11:55 AM      Passed - Cr in normal range and within 360 days    Creatinine, Ser  Date Value Ref Range Status  12/23/2022 0.93 0.57 - 1.00 mg/dL Final         Passed - Completed PHQ-2 or PHQ-9 in the last 360 days      Passed - Valid encounter within last 12 months    Recent Outpatient Visits           2 weeks ago Centrilobular emphysema (HCC)   [place] [facility] [place], Corrie Dandy T, NP   1 year ago Centrilobular emphysema (HCC)   [place] [facility] [place], Corrie Dandy T, NP   2 years ago Centrilobular emphysema (HCC)   [place] [facility] [place], [place] T, NP   3 years ago Chronic bilateral low back pain with right-sided sciatica   [place] [facility] [place], Corrie Dandy T, NP   3 years ago Centrilobular emphysema (HCC)   [place] [facility] [place], Corrie Dandy T, NP       Future Appointments             In 5 months Cannady, [place] T, NP  [facility]             meloxicam (MOBIC) 7.5 MG tablet 60 tablet 3    Sig: TAKE 1 TABLET (7.5 MG TOTAL) BY MOUTH ONCE A DAY AS NEEDED ONLY     Analgesics:  COX2 Inhibitors Failed - 01/07/2023 11:55 AM      Failed - Manual Review: Labs  are only required if the patient has taken medication for more than 8 weeks.      Failed - AST in normal range and within 360 days    AST  Date Value Ref Range Status  12/23/2022 41 (H) 0 - 40 IU/L Final         Failed - ALT in normal range and within 360 days    ALT  Date Value Ref Range Status  12/23/2022 51 (H) 0 - 32 IU/L Final         Passed -  HGB in normal range and within 360 days    Hemoglobin  Date Value Ref Range Status  12/23/2022 12.1 11.1 - 15.9 g/dL Final         Passed - Cr in normal range and within 360 days    Creatinine, Ser  Date Value Ref Range Status  12/23/2022 0.93 0.57 - 1.00 mg/dL Final         Passed - HCT in normal range and within 360 days    Hematocrit  Date Value Ref Range Status  12/23/2022 37.3 34.0 - 46.6 % Final         Passed - eGFR is 30 or above and within 360 days    GFR calc Af Amer  Date Value Ref Range Status  08/09/2019 62 >59 mL/min/1.73 Final    Comment:    **[facility] currently reports eGFR in compliance with the current**   recommendations of the [employer]. [facility] will   update reporting as new guidelines are published from the NKF-ASN   Task force.    GFR, Estimated  Date Value Ref Range Status  08/10/2022 >60 >60 mL/min Final    Comment:    (NOTE) Calculated using the CKD-EPI Creatinine Equation (2021)    eGFR  Date Value Ref Range Status  12/23/2022 70 >59 mL/min/1.73 Final         Passed - Patient is not pregnant      Passed - Valid encounter within last 12 months    Recent Outpatient Visits           2 weeks ago Centrilobular emphysema (HCC)   [place] [facility] [place], Corrie Dandy T, NP   1 year ago Centrilobular emphysema (HCC)   [place] [facility] [place], Corrie Dandy T, NP   2 years ago Centrilobular emphysema (HCC)   [place] [facility] [place], [place] T, NP   3 years ago Chronic bilateral low back pain with right-sided sciatica   [place] [facility] [place], Corrie Dandy T, NP   3 years ago Centrilobular emphysema (HCC)   [place] [facility] [place], Corrie Dandy T, NP       Future Appointments             In 5 months Cannady, [place] T, NP [place] [facility]             sertraline (ZOLOFT) 100 MG tablet 180 tablet 4    Sig: Take 2  tablets (200 mg total) by mouth daily.     Psychiatry:  Antidepressants - SSRI - sertraline Failed - 01/07/2023 11:55 AM      Failed - AST in normal range and within 360 days    AST  Date Value Ref Range Status  12/23/2022 41 (H) 0 - 40 IU/L Final         Failed - ALT in normal range and within 360 days  ALT  Date Value Ref Range Status  12/23/2022 51 (H) 0 - 32 IU/L Final         Passed - Completed PHQ-2 or PHQ-9 in the last 360 days      Passed - Valid encounter within last 6 months    Recent Outpatient Visits           2 weeks ago Centrilobular emphysema (HCC)   [place] [facility] [place], Corrie Dandy T, NP   1 year ago Centrilobular emphysema (HCC)   [place] [facility] [place], Corrie Dandy T, NP   2 years ago Centrilobular emphysema (HCC)   [place] [facility] [place], Corrie Dandy T, NP   3 years ago Chronic bilateral low back pain with right-sided sciatica   [place] [facility] [place], Corrie Dandy T, NP   3 years ago Centrilobular emphysema (HCC)   [place] [facility] [place], Dorie Rank, NP       Future Appointments             In 5 months Cannady, [place] T, NP [place] [facility]             umeclidinium-vilanterol (ANORO ELLIPTA) 62.5-25 MCG/ACT AEPB 60 each 4    Sig: Inhale 1 puff into the lungs daily.     Pulmonology:  Combination Products Passed - 01/07/2023 11:55 AM      Passed - Valid encounter within last 12 months    Recent Outpatient Visits           2 weeks ago Centrilobular emphysema (HCC)   [place] [facility] [place], [place] T, NP   1 year ago Centrilobular emphysema (HCC)   [place] [facility] [place], Corrie Dandy T, NP   2 years ago Centrilobular emphysema (HCC)   [place] [facility] [place], Corrie Dandy T, NP   3 years ago Chronic bilateral low back pain with right-sided sciatica   [facility] [place], [place] T, NP   3 years ago Centrilobular emphysema (HCC)   [place] [facility] Co[place], Corrie Dandy T, NP       Future Appointments             In 5 months Cannady, Dorie Rank, NP [place] [facility]             albuterol (VENTOLIN HFA) 108 (90 Base) MCG/ACT inhaler 324 g 4    Sig: Inhale 1-2 puffs into the lungs every 6 (six) hours as needed for wheezing or shortness of breath.     Pulmonology:  Beta Agonists 2 Passed - 01/07/2023 11:55 AM      Passed - Last BP in normal range    BP Readings from Last 1 Encounters:  12/23/22 123/61         Passed - Last Heart Rate in normal range    Pulse Readings from Last 1 Encounters:  12/23/22 83         Passed - Valid encounter within last 12 months    Recent Outpatient Visits           2 weeks ago Centrilobular emphysema (HCC)   [facility] [place], Corrie Dandy T, NP   1 year ago Centrilobular emphysema (HCC)   [place] [facility] [place], Corrie Dandy T, NP   2 years ago Centrilobular emphysema [facility])    [facility] [place], Dorie Rank, NP  3 years ago Chronic bilateral low back pain with right-sided sciatica   [place] [facility] [place], Corrie Dandy T, NP   3 years ago Centrilobular emphysema [facility])   [place] [facility] [place], Dorie Rank, NP       Future Appointments             In 5 months Cannady, Dorie Rank, NP [place] [facility]

## 2023-02-04 ENCOUNTER — Other Ambulatory Visit: Payer: Medicaid Other

## 2023-02-04 DIAGNOSIS — R7989 Other specified abnormal findings of blood chemistry: Secondary | ICD-10-CM | POA: Diagnosis not present

## 2023-02-04 DIAGNOSIS — E039 Hypothyroidism, unspecified: Secondary | ICD-10-CM | POA: Diagnosis not present

## 2023-02-05 LAB — TSH: TSH: 0.498 u[IU]/mL (ref 0.450–4.500)

## 2023-02-05 LAB — HEPATIC FUNCTION PANEL
ALT: 44 [IU]/L — ABNORMAL HIGH (ref 0–32)
AST: 38 [IU]/L (ref 0–40)
Albumin: 4.6 g/dL (ref 3.8–4.9)
Alkaline Phosphatase: 89 [IU]/L (ref 44–121)
Bilirubin Total: 0.6 mg/dL (ref 0.0–1.2)
Bilirubin, Direct: 0.2 mg/dL (ref 0.00–0.40)
Total Protein: 7.6 g/dL (ref 6.0–8.5)

## 2023-02-05 LAB — T4, FREE: Free T4: 1.38 ng/dL (ref 0.82–1.77)

## 2023-02-05 NOTE — Progress Notes (Signed)
Good morning, please let Katrina Curtis know labs have returned with exception of one which is still pending but I will alert her if abnormal.  At present her liver function is improving, but ALT still a little high.  Please ensure to cut back on alcohol if use is present.  Thyroid labs are stable this check, continue current Levothyroxine dosing.  Any questions? Keep being stellar!!  Thank you for allowing me to participate in your care.  I appreciate you. Kindest regards, Ferd Horrigan

## 2023-02-06 DIAGNOSIS — N3281 Overactive bladder: Secondary | ICD-10-CM | POA: Diagnosis not present

## 2023-02-06 DIAGNOSIS — N3946 Mixed incontinence: Secondary | ICD-10-CM | POA: Diagnosis not present

## 2023-03-09 DIAGNOSIS — N3946 Mixed incontinence: Secondary | ICD-10-CM | POA: Diagnosis not present

## 2023-03-09 DIAGNOSIS — N3281 Overactive bladder: Secondary | ICD-10-CM | POA: Diagnosis not present

## 2023-03-18 ENCOUNTER — Ambulatory Visit: Payer: Self-pay | Admitting: *Deleted

## 2023-03-18 ENCOUNTER — Other Ambulatory Visit: Payer: Self-pay | Admitting: Nurse Practitioner

## 2023-03-18 MED ORDER — LEVOTHYROXINE SODIUM 150 MCG PO TABS: 150.0000 ug | ORAL_TABLET | Freq: Every day | ORAL | 3 refills | Status: DC

## 2023-03-18 NOTE — Telephone Encounter (Signed)
Summary: med ?   Pt, called in about levothyroxzine, states dosage was changed to 150 for this med , but when she got her med, it shows 175 mg          Attempted to reach, VM full, unable to leave message to call back.Marland Kitchen

## 2023-03-18 NOTE — Telephone Encounter (Signed)
Third attempt to reach pt, VM full, unable to leave message to call back. Routing to practice for PCPs resolution per protocol.

## 2023-03-19 NOTE — Telephone Encounter (Signed)
Called and notified patient about medication.

## 2023-03-30 ENCOUNTER — Other Ambulatory Visit: Payer: Self-pay | Admitting: Nurse Practitioner

## 2023-04-03 ENCOUNTER — Other Ambulatory Visit: Payer: Self-pay | Admitting: Nurse Practitioner

## 2023-04-03 NOTE — Telephone Encounter (Signed)
 Requested Prescriptions  Pending Prescriptions Disp Refills   sertraline  (ZOLOFT ) 100 MG tablet [Pharmacy Med Name: SERTRALINE  HCL 100 MG TABLET] 180 tablet 1    Sig: TAKE 2 TABLETS BY MOUTH EVERY DAY     Psychiatry:  Antidepressants - SSRI - sertraline  Failed - 04/03/2023  3:31 PM      Failed - ALT in normal range and within 360 days    ALT  Date Value Ref Range Status  02/04/2023 44 (H) 0 - 32 IU/L Final         Passed - AST in normal range and within 360 days    AST  Date Value Ref Range Status  02/04/2023 38 0 - 40 IU/L Final         Passed - Completed PHQ-2 or PHQ-9 in the last 360 days      Passed - Valid encounter within last 6 months    Recent Outpatient Visits           3 months ago Centrilobular emphysema (HCC)   [place] [facility] [place], Melanie T, NP   1 year ago Centrilobular emphysema (HCC)   [place] [facility] [place], Melanie T, NP   2 years ago Centrilobular emphysema (HCC)   [place] [place], [place] T, NP   3 years ago Chronic bilateral low back pain with right-sided sciatica   [place] [facility] [place], Melanie T, NP   3 years ago Centrilobular emphysema (HCC)   [place] [facility] [place], Melanie DASEN, NP       Future Appointments             In 2 months Cannady, Jolene T, NP [place] [facility]

## 2023-04-03 NOTE — Telephone Encounter (Signed)
 Copied from CRM [id number]. Topic: General - Other >> Apr 03, 2023 11:33 AM Everette C wrote: Reason for CRM: Medication Refill - Most Recent Primary Care Visit:  Provider: [facility] Department[facility] Visit Type: LAB Date: 02/04/2023  Medication: sertraline  (ZOLOFT ) 100 MG tablet [[id number]]  meloxicam  (MOBIC ) 7.5 MG tablet [[id number]]  Has the patient contacted their pharmacy? Yes (Agent: If no, request that the patient contact the pharmacy for the refill. If patient does not wish to contact the pharmacy document the reason why and proceed with request.) (Agent: If yes, when and what did the pharmacy advise?)  Is this the correct pharmacy for this prescription? Yes If no, delete pharmacy and type the correct one.  This is the patient's preferred pharmacy:  [id number] [place] [facility] [address] [place] [address] Phone: [phone] Fax: [phone]  Has the prescription been filled recently? Yes  Is the patient out of the medication? Yes  Has the patient been seen for an appointment in the last year OR does the patient have an upcoming appointment? Yes  Can we respond through MyChart? No  Agent: Please be advised that Rx refills may take up to 3 business days. We ask that you follow-up with your pharmacy.

## 2023-04-07 NOTE — Telephone Encounter (Signed)
 Requested Prescriptions  Refused Prescriptions Disp Refills   sertraline  (ZOLOFT ) 100 MG tablet 180 tablet 4    Sig: Take 2 tablets (200 mg total) by mouth daily.     Psychiatry:  Antidepressants - SSRI - sertraline  Failed - 04/07/2023  8:57 AM      Failed - ALT in normal range and within 360 days    ALT  Date Value Ref Range Status  02/04/2023 44 (H) 0 - 32 IU/L Final         Passed - AST in normal range and within 360 days    AST  Date Value Ref Range Status  02/04/2023 38 0 - 40 IU/L Final         Passed - Completed PHQ-2 or PHQ-9 in the last 360 days      Passed - Valid encounter within last 6 months    Recent Outpatient Visits           3 months ago Centrilobular emphysema (HCC)   [place] [facility] [place], Melanie T, NP   1 year ago Centrilobular emphysema (HCC)   [place] [facility] [place], Melanie T, NP   2 years ago Centrilobular emphysema (HCC)   [place] [facility] [place], [place] T, NP   3 years ago Chronic bilateral low back pain with right-sided sciatica   [place] [facility] [place], Melanie T, NP   3 years ago Centrilobular emphysema (HCC)   [place] [facility] [place], Melanie T, NP       Future Appointments             In 2 months Cannady, Jolene T, NP [place] [facility]             meloxicam  (MOBIC ) 7.5 MG tablet 60 tablet 3    Sig: TAKE 1 TABLET (7.5 MG TOTAL) BY MOUTH ONCE A DAY AS NEEDED ONLY     Analgesics:  COX2 Inhibitors Failed - 04/07/2023  8:57 AM      Failed - Manual Review: Labs are only required if the patient has taken medication for more than 8 weeks.      Failed - ALT in normal range and within 360 days    ALT  Date Value Ref Range Status  02/04/2023 44 (H) 0 - 32 IU/L Final         Passed - HGB in normal range and within 360 days    Hemoglobin  Date Value Ref Range Status  12/23/2022 12.1 11.1 - 15.9 g/dL Final          Passed - Cr in normal range and within 360 days    Creatinine, Ser  Date Value Ref Range Status  12/23/2022 0.93 0.57 - 1.00 mg/dL Final         Passed - HCT in normal range and within 360 days    Hematocrit  Date Value Ref Range Status  12/23/2022 37.3 34.0 - 46.6 % Final         Passed - AST in normal range and within 360 days    AST  Date Value Ref Range Status  02/04/2023 38 0 - 40 IU/L Final         Passed - eGFR is 30 or above and within 360 days    GFR calc Af Amer  Date Value Ref Range Status  08/09/2019 62 >59 mL/min/1.73 Final    Comment:    **[facility] currently reports  eGFR in compliance with the current**   recommendations of the [employer]. [facility] will   update reporting as new guidelines are published from the NKF-ASN   Task force.    GFR, Estimated  Date Value Ref Range Status  08/10/2022 >60 >60 mL/min Final    Comment:    (NOTE) Calculated using the CKD-EPI Creatinine Equation (2021)    eGFR  Date Value Ref Range Status  12/23/2022 70 >59 mL/min/1.73 Final         Passed - Patient is not pregnant      Passed - Valid encounter within last 12 months    Recent Outpatient Visits           3 months ago Centrilobular emphysema (HCC)   [place] [facility] [place], Melanie T, NP   1 year ago Centrilobular emphysema (HCC)   [place] [facility] [place], Jolene T, NP   2 years ago Centrilobular emphysema (HCC)   [place] [facility] [place], Jolene T, NP   3 years ago Chronic bilateral low back pain with right-sided sciatica   [place] [facility] [place], Melanie T, NP   3 years ago Centrilobular emphysema (HCC)   [place] [facility] [place], Melanie DASEN, NP       Future Appointments             In 2 months Cannady, Jolene T, NP  [facility], [facility]

## 2023-04-07 NOTE — Telephone Encounter (Signed)
 Called pharmacy -  meloxicam has been waiting for p/u since 04/03/2023.

## 2023-04-07 NOTE — Telephone Encounter (Signed)
 Requested Prescriptions  Pending Prescriptions Disp Refills   atorvastatin  (LIPITOR) 40 MG tablet [Pharmacy Med Name: ATORVASTATIN  40 MG TABLET] 90 tablet 2    Sig: TAKE 1 TABLET BY MOUTH EVERY DAY     Cardiovascular:  Antilipid - Statins Failed - 04/07/2023 10:18 AM      Failed - Lipid Panel in normal range within the last 12 months    Cholesterol, Total  Date Value Ref Range Status  12/23/2022 167 100 - 199 mg/dL Final   LDL Chol Calc (NIH)  Date Value Ref Range Status  12/23/2022 75 0 - 99 mg/dL Final   HDL  Date Value Ref Range Status  12/23/2022 54 >39 mg/dL Final   Triglycerides  Date Value Ref Range Status  12/23/2022 235 (H) 0 - 149 mg/dL Final         Passed - Patient is not pregnant      Passed - Valid encounter within last 12 months    Recent Outpatient Visits           3 months ago Centrilobular emphysema (HCC)   [place] [facility] [place], [place] T, NP   1 year ago Centrilobular emphysema (HCC)   [place] [facility] [place], Melanie T, NP   2 years ago Centrilobular emphysema (HCC)   [place] [facility] [place], [place] T, NP   3 years ago Chronic bilateral low back pain with right-sided sciatica   [place] [facility] [place], Melanie T, NP   3 years ago Centrilobular emphysema (HCC)    [facility] [place], Melanie DASEN, NP       Future Appointments             In 2 months Cannady, Jolene T, NP  [facility]

## 2023-04-08 DIAGNOSIS — N3946 Mixed incontinence: Secondary | ICD-10-CM | POA: Diagnosis not present

## 2023-04-08 DIAGNOSIS — N3281 Overactive bladder: Secondary | ICD-10-CM | POA: Diagnosis not present

## 2023-05-20 DIAGNOSIS — N3281 Overactive bladder: Secondary | ICD-10-CM | POA: Diagnosis not present

## 2023-05-20 DIAGNOSIS — N3946 Mixed incontinence: Secondary | ICD-10-CM | POA: Diagnosis not present

## 2023-05-22 DIAGNOSIS — N3946 Mixed incontinence: Secondary | ICD-10-CM | POA: Diagnosis not present

## 2023-05-22 DIAGNOSIS — N3281 Overactive bladder: Secondary | ICD-10-CM | POA: Diagnosis not present

## 2023-06-22 ENCOUNTER — Ambulatory Visit: Payer: Self-pay | Admitting: Nurse Practitioner

## 2023-07-11 NOTE — Patient Instructions (Signed)
 Eating Plan for Chronic Obstructive Pulmonary Disease Chronic obstructive pulmonary disease (COPD) causes symptoms such as shortness of breath, coughing, and chest discomfort. These symptoms can make it difficult to eat enough to maintain a healthy weight. Generally, people with COPD should eat a diet that is high in calories, protein, and other nutrients to maintain body weight and to keep the lungs as healthy as possible. Depending on the medicines you take and other health conditions you may have, your health care provider may give you additional recommendations on what to eat or avoid. Talk with your health care provider about your goals for body weight, and work with a dietitian to develop an eating plan that is right for you. What are tips for following this plan? Reading food labels  Avoid foods with more than 300 milligrams (mg) of salt (sodium) per serving. Choose foods that contain at least 4 grams (g) of fiber per serving. Try to eat 20-30 g of fiber each day. Choose foods that are high in calories and protein, such as nuts, beans, yogurt, and cheese. Shopping Do not buy foods labeled as diet, low-calorie, or low-fat. If you are able to eat dairy products: Avoid low-fat or skim milk. Buy dairy products that have at least 2% fat. Buy nutritional supplement drinks. Buy grains and prepared foods labeled as enriched or fortified. Consider buying low-sodium, pre-made foods to conserve energy for eating. Cooking Add dry milk or protein powder to smoothies. Cook with healthy fats, such as olive oil, canola oil, sunflower oil, and grapeseed oil. Add oil, butter, cream cheese, or nut butters to foods to increase fat and calories. To make foods easier to chew and swallow: Cook vegetables, pasta, and rice until soft. Cut or grind meat into very small pieces. Dip breads in liquid. Meal planning  Eat when you feel hungry. Eat 5-6 small meals throughout the day. Drink 6-8 glasses of water  each day. Do not drink liquids with meals. Drink liquids at the end of the meal to avoid feeling full too quickly. Eat a variety of fruits and vegetables every day. Ask for assistance from family or friends with planning and preparing meals as needed. Avoid foods that cause you to feel bloated, such as carbonated drinks, fried foods, beans, broccoli, cabbage, and apples. For older adults, ask your local agency on aging whether you are eligible for meal assistance programs, such as Meals on Wheels. Lifestyle  Do not smoke. Eat slowly. Take small bites and chew food well before swallowing. Do not overeat. This may make it more difficult to breathe after eating. Sit up while eating. If needed, continue to use supplemental oxygen while eating. Rest or relax for 30 minutes before and after eating. Monitor your weight as told by your health care provider. Exercise as told by your health care provider. What foods should I eat? Fruits All fresh, dried, canned, or frozen fruits that do not cause gas. Vegetables All fresh, canned (no salt added), or frozen vegetables that do not cause gas. Grains Whole-grain bread. Enriched whole-grain pasta. Fortified whole-grain cereals. Fortified rice. Quinoa. Meats and other proteins Lean meat. Poultry. Fish. Dried beans. Unsalted nuts. Tofu. Eggs. Nut butters. Dairy Whole or 2% milk. Cheese. Yogurt. Fats and oils Olive oil. Canola oil. Butter. Margarine. Beverages Water. Vegetable juice (no salt added). Decaffeinated coffee. Decaffeinated or herbal tea. Seasonings and condiments Fresh or dried herbs. Low-salt or salt-free seasonings. Low-sodium soy sauce. The items listed above may not be a complete list of foods  and beverages you can eat. Contact a dietitian for more information. What foods should I avoid? Fruits Fruits that cause gas, such as apples or melon. Vegetables Vegetables that cause gas, such as broccoli, Brussels sprouts, cabbage,  cauliflower, and onions. Canned vegetables with added salt. Meats and other proteins Fried meat. Salt-cured meat. Processed meat. Dairy Fat-free or low-fat milk, yogurt, or cheese. Processed cheese. Beverages Carbonated drinks. Caffeinated drinks, such as coffee, tea, and soft drinks. Juice. Alcohol. Vegetable juice with added salt. Seasonings and condiments Salt. Seasoning mixes with salt. Soy sauce. Rosita Fire. Other foods Clear soup or broth. Fried foods. Prepared frozen meals. The items listed above may not be a complete list of foods and beverages you should avoid. Contact a dietitian for more information. Summary COPD symptoms can make it difficult to eat enough to maintain a healthy weight. A COPD eating plan can help you maintain your body weight and keep your lungs as healthy as possible. Eat a diet that is high in calories, protein, and other nutrients. Read labels to make sure that you are getting the right nutrients. Cook foods to make them easier to chew and swallow. Eat 5-6 small meals throughout the day, and avoid foods that cause gas or make you feel bloated. This information is not intended to replace advice given to you by your health care provider. Make sure you discuss any questions you have with your health care provider. Document Revised: 01/23/2023 Document Reviewed: 01/23/2023 Elsevier Patient Education  2024 [employer].

## 2023-07-13 ENCOUNTER — Ambulatory Visit: Admitting: Nurse Practitioner

## 2023-07-13 ENCOUNTER — Encounter: Payer: Self-pay | Admitting: Nurse Practitioner

## 2023-07-13 VITALS — BP 138/86 | HR 80 | Temp 98.4°F | Ht 67.0 in | Wt 220.0 lb

## 2023-07-13 DIAGNOSIS — F339 Major depressive disorder, recurrent, unspecified: Secondary | ICD-10-CM

## 2023-07-13 DIAGNOSIS — I700 Atherosclerosis of aorta: Secondary | ICD-10-CM | POA: Diagnosis not present

## 2023-07-13 DIAGNOSIS — J432 Centrilobular emphysema: Secondary | ICD-10-CM

## 2023-07-13 DIAGNOSIS — N3281 Overactive bladder: Secondary | ICD-10-CM

## 2023-07-13 DIAGNOSIS — F17210 Nicotine dependence, cigarettes, uncomplicated: Secondary | ICD-10-CM

## 2023-07-13 DIAGNOSIS — I2510 Atherosclerotic heart disease of native coronary artery without angina pectoris: Secondary | ICD-10-CM

## 2023-07-13 DIAGNOSIS — N1831 Chronic kidney disease, stage 3a: Secondary | ICD-10-CM | POA: Diagnosis not present

## 2023-07-13 DIAGNOSIS — E739 Lactose intolerance, unspecified: Secondary | ICD-10-CM | POA: Diagnosis not present

## 2023-07-13 DIAGNOSIS — E782 Mixed hyperlipidemia: Secondary | ICD-10-CM

## 2023-07-13 DIAGNOSIS — E66811 Obesity, class 1: Secondary | ICD-10-CM | POA: Diagnosis not present

## 2023-07-13 DIAGNOSIS — E039 Hypothyroidism, unspecified: Secondary | ICD-10-CM | POA: Diagnosis not present

## 2023-07-13 DIAGNOSIS — I10 Essential (primary) hypertension: Secondary | ICD-10-CM

## 2023-07-13 NOTE — Progress Notes (Deleted)
 BP 138/86   Pulse 80   Temp 98.4 F (36.9 C) (Oral)   Ht 5\' 7"  (1.702 m)   Wt 220 lb (99.8 kg)   SpO2 96%   BMI 34.46 kg/m    Subjective:    Patient ID: Katrina Curtis, female    DOB: 27-Jun-1960, 63 y.o.   MRN: [id number]  HPI: Katrina Curtis is a 63 y.o. female  Chief Complaint  Patient presents with   Chronic Kidney Disease   COPD   Depression   Hyperlipidemia   Hypertension   HYPERTENSION / HYPERLIPIDEMIA Taking Lisinopril and Atorvastatin daily.  Satisfied with current treatment? {Blank single:19197::"yes","no"} Duration of hypertension: {Blank single:19197::"chronic","months","years"} BP monitoring frequency: {Blank single:19197::"not checking","rarely","daily","weekly","monthly","a few times a day","a few times a week","a few times a month"} BP range:  BP medication side effects: {Blank single:19197::"yes","no"} Duration of hyperlipidemia: {Blank single:19197::"chronic","months","years"} Cholesterol medication side effects: {Blank single:19197::"yes","no"} Cholesterol supplements: {Blank multiple:19196::"none","fish oil","niacin","red yeast rice"} Medication compliance: {Blank single:19197::"excellent compliance","good compliance","fair compliance","poor compliance"} Aspirin: {Blank single:19197::"yes","no"} Recent stressors: {Blank single:19197::"yes","no"} Recurrent headaches: {Blank single:19197::"yes","no"} Visual changes: {Blank single:19197::"yes","no"} Palpitations: {Blank single:19197::"yes","no"} Dyspnea: {Blank single:19197::"yes","no"} Chest pain: {Blank single:19197::"yes","no"} Lower extremity edema: {Blank single:19197::"yes","no"} Dizzy/lightheaded: {Blank single:19197::"yes","no"}   COPD Uses Anoro and Albuterol. Continues to smoke about 1/2 PPD.  Smoker since she was about 16.    Lung screening last on 12/29/19, this was benign. 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 COPD status: {Blank single:19197::"controlled","uncontrolled","better","worse","exacerbated","stable"} Satisfied with current treatment?: {Blank single:19197::"yes","no"} Oxygen use: {Blank single:19197::"yes","no"} Dyspnea frequency:  Cough frequency:  Rescue inhaler frequency:   Limitation of activity: {Blank single:19197::"yes","no"} Productive cough:  Last Spirometry:  Pneumovax: {Blank single:19197::"Up to Date","Not up to Date","unknown"} Influenza: {Blank single:19197::"Up to Date","Not up to Date","unknown"}   CHRONIC KIDNEY DISEASE CKD status: {Blank single:19197::"controlled","uncontrolled","better","worse","exacerbated","stable"} Medications renally dose: {Blank single:19197::"yes","no"} Previous renal evaluation: {Blank single:19197::"yes","no"} Pneumovax:  {Blank single:19197::"Up to Date","Not up to Date","unknown"} Influenza Vaccine:  {Blank single:19197::"Up to Date","Not up to Date","unknown"}   HYPOTHYROIDISM Continues on Levothyroxine 150 MCG.  Thyroid control status:{Blank single:19197::"controlled","uncontrolled","better","worse","exacerbated","stable"} Satisfied with current treatment? {Blank single:19197::"yes","no"} Medication side effects: {Blank single:19197::"yes","no"} Medication compliance: {Blank single:19197::"excellent compliance","good compliance","fair compliance","poor compliance"} Etiology of hypothyroidism:  Recent dose adjustment:{Blank single:19197::"yes","no"} Fatigue: {Blank single:19197::"yes","no"} Cold intolerance: {Blank single:19197::"yes","no"} Heat intolerance: {Blank single:19197::"yes","no"} Weight gain: {Blank single:19197::"yes","no"} Weight loss: {Blank single:19197::"yes","no"} Constipation: {Blank single:19197::"yes","no"} Diarrhea/loose stools: {Blank single:19197::"yes","no"} Palpitations: {Blank single:19197::"yes","no"} Lower extremity edema: {Blank  single:19197::"yes","no"} Anxiety/depressed mood: {Blank single:19197::"yes","no"}   DEPRESSION Continues on Sertraline 200 MG daily.   Mood status: {Blank single:19197::"controlled","uncontrolled","better","worse","exacerbated","stable"} Satisfied with current treatment?: {Blank single:19197::"yes","no"} Symptom severity: {Blank single:19197::"mild","moderate","severe"}  Duration of current treatment : {Blank single:19197::"chronic","months","years"} Side effects: {Blank single:19197::"yes","no"} Medication compliance: {Blank single:19197::"excellent compliance","good compliance","fair compliance","poor compliance"} Psychotherapy/counseling: {Blank single:19197::"yes","no"} {Blank single:19197::"current","in the past"} Depressed mood: {Blank single:19197::"yes","no"} Anxious mood: {Blank single:19197::"yes","no"} Anhedonia: {Blank single:19197::"yes","no"} Significant weight loss or gain: {Blank single:19197::"yes","no"} Insomnia: {Blank single:19197::"yes","no"} {Blank single:19197::"hard to fall asleep","hard to stay asleep"} Fatigue: {Blank single:19197::"yes","no"} Feelings of worthlessness or guilt: {Blank single:19197::"yes","no"} Impaired concentration/indecisiveness: {Blank single:19197::"yes","no"} Suicidal ideations: {Blank single:19197::"yes","no"} Hopelessness: {Blank single:19197::"yes","no"} Crying spells: {Blank single:19197::"yes","no"}    07/13/2023    1:03 PM 12/23/2022    4:05 PM 10/03/2021   12:07 PM 06/26/2020    2:08 PM 08/09/2019   11:06 AM  Depression screen PHQ 2/9  Decreased Interest 1 1 2  0 0  Down, Depressed, Hopeless 0 1 0 0 0  PHQ - 2 Score 1 2 2  0 0  Altered sleeping 2 2 3  0 0  Tired, decreased energy 2 1 3 3 1   Change in appetite 0 1 0 0 1  Feeling bad or failure about yourself  1 1 1  0 0  Trouble concentrating 1 0 1 0 0  Moving slowly or fidgety/restless 0 1 1 0 0  Suicidal thoughts 0 0 0 0 0  PHQ-9 Score 7 8 11 3 2   Difficult doing work/chores  Not difficult at all Somewhat difficult Somewhat difficult  Not difficult at all       07/13/2023    1:03 PM 12/23/2022    4:05 PM 10/03/2021   12:07 PM 11/01/2018   10:49 AM  GAD 7 : Generalized Anxiety Score  Nervous, Anxious, on Edge 0 1 1 1   Control/stop worrying 3 3 1 1   Worry too much - different things 3 3 1 1   Trouble relaxing 2 2 1 1   Restless 1 0 0 1  Easily annoyed or irritable 1 1 1 1   Afraid - awful might happen 2 2 1 1   Total GAD 7 Score 12 12 6 7   Anxiety Difficulty Not difficult at all Somewhat difficult Somewhat difficult Not difficult at all   Relevant past medical, surgical, family and social history reviewed and updated as indicated. Interim medical history since our last visit reviewed. Allergies and medications reviewed and updated.  Review of Systems  Per HPI unless specifically indicated above     Objective:    BP 138/86   Pulse 80   Temp 98.4 F (36.9 C) (Oral)   Ht 5\' 7"  (1.702 m)   Wt 220 lb (99.8 kg)   SpO2 96%   BMI 34.46 kg/m   Wt Readings from Last 3 Encounters:  07/13/23 220 lb (99.8 kg)  12/23/22 212 lb 12.8 oz (96.5 kg)  08/10/22 211 lb (95.7 kg)    Physical Exam  Results for orders placed or performed in visit on 02/04/23  Hepatic function panel   Collection Time: 02/04/23  1:57 PM  Result Value Ref Range   Total Protein 7.6 6.0 - 8.5 g/dL   Albumin 4.6 3.8 - 4.9 g/dL   Bilirubin Total 0.6 0.0 - 1.2 mg/dL   Bilirubin, Direct 1.61 0.00 - 0.40 mg/dL   Alkaline Phosphatase 89 44 - 121 IU/L   AST 38 0 - 40 IU/L   ALT 44 (H) 0 - 32 IU/L  TSH   Collection Time: 02/04/23  1:57 PM  Result Value Ref Range   TSH 0.498 0.450 - 4.500 uIU/mL  T4, free   Collection Time: 02/04/23  1:57 PM  Result Value Ref Range   Free T4 1.38 0.82 - 1.77 ng/dL      Assessment & Plan:   Problem List Items Addressed This Visit       Cardiovascular and Mediastinum   Aortic atherosclerosis (HCC)   Relevant Orders   Comprehensive metabolic panel  with GFR   Lipid Panel w/o Chol/HDL Ratio   CAD (coronary artery disease)   HTN (hypertension), benign     Respiratory   Centrilobular emphysema (HCC) - Primary     Endocrine   Hypothyroid     Genitourinary   CKD (chronic kidney disease) stage 3, GFR 30-59 ml/min (HCC)   Overactive bladder     Other   Depression, recurrent (HCC)   Mixed hyperlipidemia   Relevant Orders   Comprehensive metabolic panel with GFR   Lipid Panel w/o Chol/HDL Ratio   Nicotine dependence,  cigarettes, uncomplicated   Obesity     Follow up plan: No follow-ups on file.

## 2023-07-13 NOTE — Assessment & Plan Note (Signed)
 BMI 34.56. Recommend patient eat smaller high protein, low fat meals more frequently. Recommend exercising 5 times weekly for at least 30 min per day. Return in 6 months.

## 2023-07-13 NOTE — Assessment & Plan Note (Signed)
 Chronic, stable. No recent acute exacerbations. Continue current medication regimen Albuterol as needed, and Anoro daily. Recommend complete smoking cessation. Return in 6 months.

## 2023-07-13 NOTE — Assessment & Plan Note (Signed)
 Reports episodes of diarrhea after consuming dairy products. Recommend OTC Lactaid 30 mins before consuming diary products. Limit consumption of dairy. Return in 6 months.

## 2023-07-13 NOTE — Progress Notes (Signed)
 BP 138/86   Pulse 80   Temp 98.4 F (36.9 C) (Oral)   Ht 5\' 7"  (1.702 m)   Wt 220 lb (99.8 kg)   SpO2 96%   BMI 34.46 kg/m    Subjective:    Patient ID: Katrina Curtis, female    DOB: 1960/07/01, 63 y.o.   MRN: [id number]  HPI: Katrina Curtis is a 63 y.o. female over active bladder, now wearing adult briefs through [facility]. One fall with no injury in the last 3 months.  Chief Complaint  Patient presents with   Chronic Kidney Disease   COPD   Depression   Hyperlipidemia   Hypertension   NOTE WRITTEN BY DNP STUDENT.  ASSESSMENT AND PLAN OF CARE REVIEWED WITH STUDENT, AGREE WITH ABOVE FINDINGS AND PLAN.   HYPERTENSION / HYPERLIPIDEMIA Taking Lisinopril and Atorvastatin daily.  Satisfied with current treatment? yes Duration of hypertension: years BP monitoring frequency: not checking BP range:  BP medication side effects: no Duration of hyperlipidemia: years Cholesterol medication side effects: no Cholesterol supplements: none Medication compliance: good compliance Aspirin: no Recent stressors: no Recurrent headaches: no Visual changes: no Palpitations: no Dyspnea: no Chest pain: no Lower extremity edema: no Dizzy/lightheaded: no   COPD Uses Anoro and Albuterol. Continues to smoke about 1/2 PPD.  Smoker since she was about 16.    Lung screening last on 12/29/19, this was benign. Did notice CAD, stable ectatic ascending thoracic aorta 4.2 cm (was to repeat CT scan in 12 months but has not), and mild centrilobular emphysema. COPD status: stable Satisfied with current treatment?: yes Oxygen use: no Dyspnea frequency: with activity Cough frequency:  Rescue inhaler frequency:  once daily Limitation of activity: yes Productive cough:  Last Spirometry:  Pneumovax: Not up to Date Influenza: Not up to Date   CHRONIC KIDNEY DISEASE CKD status: stable Medications renally dose: yes Previous renal evaluation: no Pneumovax:  Not up to Date Influenza  Vaccine:  Not up to Date   HYPOTHYROIDISM Continues on Levothyroxine 150 MCG.  Thyroid control status:stable Satisfied with current treatment? yes Medication side effects: no Medication compliance: good compliance Etiology of hypothyroidism:  Recent dose adjustment:no Fatigue: no Cold intolerance: no Heat intolerance: no Weight gain: no Weight loss: no Constipation: no Diarrhea/loose stools: yes after drinking milk Palpitations: no Lower extremity edema: no Anxiety/depressed mood: no   DEPRESSION Continues on Sertraline 200 MG daily.   Mood status: stable Satisfied with current treatment?: yes Symptom severity: mild  Duration of current treatment : years Side effects: no Medication compliance: good compliance Psychotherapy/counseling: no in the past Depressed mood: no Anxious mood: no Anhedonia: no Significant weight loss or gain: no Insomnia: no hard to fall asleep Fatigue: no Feelings of worthlessness or guilt: yes Impaired concentration/indecisiveness:  sometimes Suicidal ideations: no Hopelessness: no Crying spells: yes    07/13/2023    1:03 PM 12/23/2022    4:05 PM 10/03/2021   12:07 PM 06/26/2020    2:08 PM 08/09/2019   11:06 AM  Depression screen PHQ 2/9  Decreased Interest 1 1 2  0 0  Down, Depressed, Hopeless 0 1 0 0 0  PHQ - 2 Score 1 2 2  0 0  Altered sleeping 2 2 3  0 0  Tired, decreased energy 2 1 3 3 1   Change in appetite 0 1 0 0 1  Feeling bad or failure about yourself  1 1 1  0 0  Trouble concentrating 1 0 1 0 0  Moving slowly or fidgety/restless 0  1 1 0 0  Suicidal thoughts 0 0 0 0 0  PHQ-9 Score 7 8 11 3 2   Difficult doing work/chores Not difficult at all Somewhat difficult Somewhat difficult  Not difficult at all       07/13/2023    1:03 PM 12/23/2022    4:05 PM 10/03/2021   12:07 PM 11/01/2018   10:49 AM  GAD 7 : Generalized Anxiety Score  Nervous, Anxious, on Edge 0 1 1 1   Control/stop worrying 3 3 1 1   Worry too much - different things 3 3  1 1   Trouble relaxing 2 2 1 1   Restless 1 0 0 1  Easily annoyed or irritable 1 1 1 1   Afraid - awful might happen 2 2 1 1   Total GAD 7 Score 12 12 6 7   Anxiety Difficulty Not difficult at all Somewhat difficult Somewhat difficult Not difficult at all   Relevant past medical, surgical, family and social history reviewed and updated as indicated. Interim medical history since our last visit reviewed. Allergies and medications reviewed and updated.  Review of Systems  Constitutional:  Negative for activity change and appetite change.  HENT: Negative.    Eyes: Negative.   Respiratory:  Negative for shortness of breath and wheezing.   Cardiovascular:  Negative for palpitations and leg swelling.  Gastrointestinal:  Positive for diarrhea.       After drinking milk and eating ice cream.  Endocrine: Negative.   Genitourinary:  Positive for frequency and urgency.       Overactive bladder.  Musculoskeletal:  Positive for back pain and joint swelling.  Skin: Negative.   Allergic/Immunologic: Negative.   Neurological:  Negative for light-headedness and headaches.  Hematological: Negative.   Psychiatric/Behavioral:  Negative for suicidal ideas. The patient is not nervous/anxious.     Per HPI unless specifically indicated above     Objective:    BP 138/86   Pulse 80   Temp 98.4 F (36.9 C) (Oral)   Ht 5\' 7"  (1.702 m)   Wt 220 lb (99.8 kg)   SpO2 96%   BMI 34.46 kg/m   Wt Readings from Last 3 Encounters:  07/13/23 220 lb (99.8 kg)  12/23/22 212 lb 12.8 oz (96.5 kg)  08/10/22 211 lb (95.7 kg)    Physical Exam Vitals and nursing note reviewed.  Constitutional:      Appearance: Normal appearance. She is obese. She is not ill-appearing.  Neck:     Thyroid: No thyroid mass.     Vascular: Carotid bruit present.  Cardiovascular:     Rate and Rhythm: Normal rate and regular rhythm.     Heart sounds: Normal heart sounds. No murmur heard. Pulmonary:     Effort: Pulmonary effort is  normal. No respiratory distress.     Breath sounds: Normal breath sounds. No wheezing.  Abdominal:     General: Bowel sounds are normal.     Palpations: Abdomen is soft.  Musculoskeletal:     Cervical back: Normal range of motion and neck supple.  Lymphadenopathy:     Cervical: No cervical adenopathy.     Right cervical: No superficial cervical adenopathy.    Left cervical: No superficial cervical adenopathy.  Skin:    General: Skin is warm and dry.  Neurological:     General: No focal deficit present.     Mental Status: She is alert and oriented to person, place, and time. Mental status is at baseline.     Deep Tendon  Reflexes: Reflexes are normal and symmetric.  Psychiatric:        Attention and Perception: Attention and perception normal.        Mood and Affect: Mood and affect normal.        Speech: Speech normal.        Behavior: Behavior normal. Behavior is cooperative.        Thought Content: Thought content normal.        Cognition and Memory: Cognition and memory normal.        Judgment: Judgment normal.     Results for orders placed or performed in visit on 02/04/23  Hepatic function panel   Collection Time: 02/04/23  1:57 PM  Result Value Ref Range   Total Protein 7.6 6.0 - 8.5 g/dL   Albumin 4.6 3.8 - 4.9 g/dL   Bilirubin Total 0.6 0.0 - 1.2 mg/dL   Bilirubin, Direct 4.13 0.00 - 0.40 mg/dL   Alkaline Phosphatase 89 44 - 121 IU/L   AST 38 0 - 40 IU/L   ALT 44 (H) 0 - 32 IU/L  TSH   Collection Time: 02/04/23  1:57 PM  Result Value Ref Range   TSH 0.498 0.450 - 4.500 uIU/mL  T4, free   Collection Time: 02/04/23  1:57 PM  Result Value Ref Range   Free T4 1.38 0.82 - 1.77 ng/dL      Assessment & Plan:   Problem List Items Addressed This Visit       Cardiovascular and Mediastinum   HTN (hypertension), benign   Chronic, stable. BP at goal in office today. Continue current medication regimen Lisinopril 20 MG daily as this offers kidney protection. Consider  changing to ARB as lung disease present and current tobacco use. Recommend complete cessation smoking. Recommend she monitor BP at least a few mornings a week at home and document.  Recommend DASH diet at home. Return in 6 months for annual physical. Labs CMP and Lipid panel.      CAD (coronary artery disease)   Chronic, stable. Continue current medication regimen Atorvastatin 40 mg daily. Labs Lipid panel and CMP. Return in 6 months.      Aortic atherosclerosis (HCC)   Chronic, ongoing. Recommend complete cessation of smoking. Continue current medication regimen Atorvastatin 40 mg daily. Labs lipid panel, CMP. Return in 6 months.      Relevant Orders   Comprehensive metabolic panel with GFR   Lipid Panel w/o Chol/HDL Ratio     Respiratory   Centrilobular emphysema (HCC) - Primary   Chronic, stable. No recent acute exacerbations. Continue current medication regimen Albuterol as needed, and Anoro daily. Recommend complete smoking cessation. Return in 6 months.        Endocrine   Hypothyroid   Chronic, stable. Continue current medication regimen. Will adjust as needed. Return in 6 months.        Genitourinary   Overactive bladder   Chronic, ongoing. Continues to see no improvement. Continues to wear briefs. Recommend she return to urology.  Return in 6 months for annual physical.      CKD (chronic kidney disease) stage 3, GFR 30-59 ml/min (HCC)   Chronic, ongoing. Continue current medication regimen Lisinopril 20 mg daily. This offers kidney protection. Labs: CMP. Return in 6 months.        Other   Obesity   BMI 34.56. Recommend patient eat smaller high protein, low fat meals more frequently. Recommend exercising 5 times weekly for at least 30 min per day.  Return in 6 months.      Nicotine dependence, cigarettes, uncomplicated   Continue to recommend complete smoking cessation. Reports she has decreased smoking to 0.5 pack per day. Past recommendations of OTC nicotine  cessation medications and prescription medications. The patient at this time is not interested in pursing OTC options or prescription options. Return in 6 months.      Depression, recurrent (HCC)   Chronic, stable. Continue current medication regimen Sertraline 100 mg daily. Will adjust as needed. Denies SI/HI. CMP today. Return in 6 months.      Mixed hyperlipidemia   Chronic, ongoing. Recommend complete smoking cessation. Lipid panel today. Continue current medication regimen. Return in 6 months.      Relevant Orders   Comprehensive metabolic panel with GFR   Lipid Panel w/o Chol/HDL Ratio   Lactose intolerance in adult   Reports episodes of diarrhea after consuming dairy products. Recommend OTC Lactaid 30 mins before consuming diary products. Limit consumption of dairy. Return in 6 months.        Follow up plan: Return in about 6 months (around 01/12/2024) for Annual Physical after 12/23/23.

## 2023-07-13 NOTE — Assessment & Plan Note (Signed)
 Chronic, stable. Continue current medication regimen Sertraline 100 mg daily. Will adjust as needed. Denies SI/HI. CMP today. Return in 6 months.

## 2023-07-13 NOTE — Assessment & Plan Note (Signed)
 Chronic, stable. Continue current medication regimen Atorvastatin 40 mg daily. Labs Lipid panel and CMP. Return in 6 months.

## 2023-07-13 NOTE — Assessment & Plan Note (Signed)
 Continue to recommend complete smoking cessation. Reports she has decreased smoking to 0.5 pack per day. Past recommendations of OTC nicotine cessation medications and prescription medications. The patient at this time is not interested in pursing OTC options or prescription options. Return in 6 months.

## 2023-07-13 NOTE — Assessment & Plan Note (Signed)
 Chronic, ongoing. Continue current medication regimen Lisinopril 20 mg daily. This offers kidney protection. Labs: CMP. Return in 6 months.

## 2023-07-13 NOTE — Assessment & Plan Note (Signed)
 Chronic, ongoing. Recommend complete cessation of smoking. Continue current medication regimen Atorvastatin 40 mg daily. Labs lipid panel, CMP. Return in 6 months.

## 2023-07-13 NOTE — Assessment & Plan Note (Signed)
 Chronic, ongoing. Recommend complete smoking cessation. Lipid panel today. Continue current medication regimen. Return in 6 months.

## 2023-07-13 NOTE — Assessment & Plan Note (Signed)
 Chronic, stable. BP at goal in office today. Continue current medication regimen Lisinopril 20 MG daily as this offers kidney protection. Consider changing to ARB as lung disease present and current tobacco use. Recommend complete cessation smoking. Recommend she monitor BP at least a few mornings a week at home and document.  Recommend DASH diet at home. Return in 6 months for annual physical. Labs CMP and Lipid panel.

## 2023-07-13 NOTE — Assessment & Plan Note (Signed)
 Chronic, ongoing. Continues to see no improvement. Continues to wear briefs. Recommend she return to urology.  Return in 6 months for annual physical.

## 2023-07-13 NOTE — Assessment & Plan Note (Signed)
 Chronic, stable. Continue current medication regimen. Will adjust as needed. Return in 6 months.

## 2023-07-14 LAB — COMPREHENSIVE METABOLIC PANEL WITH GFR
ALT: 63 IU/L — ABNORMAL HIGH (ref 0–32)
AST: 45 IU/L — ABNORMAL HIGH (ref 0–40)
Albumin: 4.5 g/dL (ref 3.9–4.9)
Alkaline Phosphatase: 92 IU/L (ref 44–121)
BUN/Creatinine Ratio: 19 (ref 12–28)
BUN: 23 mg/dL (ref 8–27)
Bilirubin Total: 0.5 mg/dL (ref 0.0–1.2)
CO2: 21 mmol/L (ref 20–29)
Calcium: 9.4 mg/dL (ref 8.7–10.3)
Chloride: 104 mmol/L (ref 96–106)
Creatinine, Ser: 1.21 mg/dL — ABNORMAL HIGH (ref 0.57–1.00)
Globulin, Total: 3 g/dL (ref 1.5–4.5)
Glucose: 92 mg/dL (ref 70–99)
Potassium: 4.6 mmol/L (ref 3.5–5.2)
Sodium: 140 mmol/L (ref 134–144)
Total Protein: 7.5 g/dL (ref 6.0–8.5)
eGFR: 51 mL/min/{1.73_m2} — ABNORMAL LOW (ref >59)

## 2023-07-14 LAB — LIPID PANEL W/O CHOL/HDL RATIO
Cholesterol, Total: 248 mg/dL — ABNORMAL HIGH (ref 100–199)
HDL: 59 mg/dL (ref >39)
LDL Chol Calc (NIH): 157 mg/dL — ABNORMAL HIGH (ref 0–99)
Triglycerides: 175 mg/dL — ABNORMAL HIGH (ref 0–149)
VLDL Cholesterol Cal: 32 mg/dL (ref 5–40)

## 2023-07-14 NOTE — Progress Notes (Signed)
 Good morning, please let Keliyah know her labs have returned: - Kidney function showing some mild decline this check.  I recommend she increase her water intake at home and try to avoid Ibuprofen products.  We will recheck next visit. - Liver function levels have trended up this check - any alcohol or Tylenol use at home? If continues to trend up at next visit I will recommend obtaining ultrasound to further assess liver. - Lipid panel shows trend up this visit, are you taking your Atorvastatin daily as ordered? If not please ensure you start to and we will recheck next visit + adjust dose if needed.  Any questions? Keep being stellar!!  Thank you for allowing me to participate in your care.  I appreciate you. Kindest regards, Ercel Normoyle

## 2023-07-28 DIAGNOSIS — N3946 Mixed incontinence: Secondary | ICD-10-CM | POA: Diagnosis not present

## 2023-07-28 DIAGNOSIS — N3281 Overactive bladder: Secondary | ICD-10-CM | POA: Diagnosis not present

## 2023-07-29 DIAGNOSIS — N3281 Overactive bladder: Secondary | ICD-10-CM | POA: Diagnosis not present

## 2023-07-29 DIAGNOSIS — N3946 Mixed incontinence: Secondary | ICD-10-CM | POA: Diagnosis not present

## 2024-04-05 ENCOUNTER — Other Ambulatory Visit: Payer: Self-pay | Admitting: Nurse Practitioner

## 2024-04-06 NOTE — Telephone Encounter (Signed)
 Scheduled

## 2024-04-13 ENCOUNTER — Encounter: Payer: Self-pay | Admitting: Nurse Practitioner

## 2024-04-14 ENCOUNTER — Other Ambulatory Visit: Payer: Self-pay | Admitting: Nurse Practitioner

## 2024-04-14 NOTE — Telephone Encounter (Signed)
 [facility] Refill Encounter  Last office visit: 07/29/2023  Verified last visit within 6 months and next visit is scheduled: No but upcoming appointment  Next office visit: 05/09/2024   Patient record reviewed by CMA and medication due for refill: Yes  Requested Prescriptions   Pending Prescriptions Disp Refills   levothyroxine  (SYNTHROID ) 150 MCG tablet [Pharmacy Med Name: LEVOTHYROXINE  150 MCG TABLET] 30 tablet 0    Sig: TAKE 1 TABLET BY MOUTH EVERY DAY   gabapentin  (NEURONTIN ) 600 MG tablet [Pharmacy Med Name: GABAPENTIN  600 MG TABLET] 30 tablet 0    Sig: TAKE 1 TABLET BY MOUTH AT BEDTIME.   ANORO ELLIPTA  62.5-25 MCG/ACT AEPB [Pharmacy Med Name: ANORO ELLIPTA  62.5-25 MCG INH] 30 each 0    Sig: TAKE 1 PUFF BY MOUTH EVERY DAY   VENTOLIN  HFA 108 (90 Base) MCG/ACT inhaler [Pharmacy Med Name: VENTOLIN  HFA 90 MCG INHALER] 18 each 0    Sig: INHALE 1 TO 2 PUFFS BY MOUTH EVERY 6 HOURS AS NEEDED FOR WHEEZE OR SHORTNESS OF BREATH   sertraline  (ZOLOFT ) 100 MG tablet [Pharmacy Med Name: SERTRALINE  HCL 100 MG TABLET] 30 tablet 0    Sig: TAKE 2 TABLETS BY MOUTH EVERY DAY

## 2024-04-22 ENCOUNTER — Other Ambulatory Visit: Payer: Self-pay | Admitting: Nurse Practitioner

## 2024-04-22 NOTE — Telephone Encounter (Signed)
 Courtesy refill. Patient will need an office visit for additional refills.  Requested Prescriptions  Pending Prescriptions Disp Refills   sertraline  (ZOLOFT ) 100 MG tablet [Pharmacy Med Name: SERTRALINE  HCL 100 MG TABLET] 30 tablet 0    Sig: TAKE 2 TABLETS BY MOUTH EVERY DAY     Psychiatry:  Antidepressants - SSRI - sertraline  Failed - 04/22/2024  2:30 PM      Failed - AST in normal range and within 360 days    AST  Date Value Ref Range Status  07/13/2023 45 (H) 0 - 40 IU/L Final         Failed - ALT in normal range and within 360 days    ALT  Date Value Ref Range Status  07/13/2023 63 (H) 0 - 32 IU/L Final         Failed - Valid encounter within last 6 months    Recent Outpatient Visits           9 months ago Centrilobular emphysema (HCC)   [facility] [place], Fletcher T, NP              Passed - Completed PHQ-2 or PHQ-9 in the last 360 days

## 2024-05-04 ENCOUNTER — Other Ambulatory Visit: Payer: Self-pay | Admitting: Nurse Practitioner

## 2024-05-05 ENCOUNTER — Ambulatory Visit: Admitting: Nurse Practitioner

## 2024-05-06 ENCOUNTER — Other Ambulatory Visit: Payer: Self-pay | Admitting: Nurse Practitioner

## 2024-05-06 NOTE — Telephone Encounter (Signed)
 Requested medication (s) are due for refill today: yes  Requested medication (s) are on the active medication list: yes  Last refill:  1/726 #30  Future visit scheduled: yes  Notes to clinic:  overdue and abnormal lab work    Requested Prescriptions  Pending Prescriptions Disp Refills   meloxicam  (MOBIC ) 7.5 MG tablet [Pharmacy Med Name: MELOXICAM  7.5 MG TABLET] 30 tablet 0    Sig: TAKE 1 TABLET (7.5 MG TOTAL) BY MOUTH ONCE A DAY AS NEEDED ONLY     Analgesics:  COX2 Inhibitors Failed - 05/06/2024 11:36 AM      Failed - Manual Review: Labs are only required if the patient has taken medication for more than 8 weeks.      Failed - HGB in normal range and within 360 days    Hemoglobin  Date Value Ref Range Status  12/23/2022 12.1 11.1 - 15.9 g/dL Final         Failed - Cr in normal range and within 360 days    Creatinine, Ser  Date Value Ref Range Status  07/13/2023 1.21 (H) 0.57 - 1.00 mg/dL Final         Failed - HCT in normal range and within 360 days    Hematocrit  Date Value Ref Range Status  12/23/2022 37.3 34.0 - 46.6 % Final         Failed - AST in normal range and within 360 days    AST  Date Value Ref Range Status  07/13/2023 45 (H) 0 - 40 IU/L Final         Failed - ALT in normal range and within 360 days    ALT  Date Value Ref Range Status  07/13/2023 63 (H) 0 - 32 IU/L Final         Passed - eGFR is 30 or above and within 360 days    GFR calc Af Amer  Date Value Ref Range Status  08/09/2019 62 >59 mL/min/1.73 Final    Comment:    **[facility] currently reports eGFR in compliance with the current**   recommendations of the [employer]. [facility] will   update reporting as new guidelines are published from the NKF-ASN   Task force.    GFR, Estimated  Date Value Ref Range Status  08/10/2022 >60 >60 mL/min Final    Comment:    (NOTE) Calculated using the CKD-EPI Creatinine Equation (2021)    eGFR  Date Value Ref Range Status   07/13/2023 51 (L) >59 mL/min/1.73 Final         Passed - Patient is not pregnant      Passed - Valid encounter within last 12 months    Recent Outpatient Visits           9 months ago Centrilobular emphysema (HCC)   [facility] [place], Melanie DASEN, NP

## 2024-05-09 ENCOUNTER — Ambulatory Visit: Admitting: Nurse Practitioner
